# Patient Record
Sex: Female | Born: 1990 | Race: Black or African American | Hispanic: No | Marital: Single | Smoking: Current some day smoker
Health system: Southern US, Community
[De-identification: ages and names within clinical notes are randomized; demographics above are authoritative.]

## PROBLEM LIST (undated history)

## (undated) DIAGNOSIS — N83201 Unspecified ovarian cyst, right side: Secondary | ICD-10-CM

## (undated) DIAGNOSIS — N83202 Unspecified ovarian cyst, left side: Secondary | ICD-10-CM

## (undated) HISTORY — PX: DILATION AND CURETTAGE OF UTERUS: SHX78

---

## 2016-01-07 ENCOUNTER — Emergency Department (HOSPITAL_COMMUNITY)
Admission: EM | Admit: 2016-01-07 | Discharge: 2016-01-07 | Disposition: A | Discharge status: Home / Self Care | Payer: Self-pay | Attending: Emergency Medicine | Admitting: Emergency Medicine

## 2016-01-07 ENCOUNTER — Encounter (HOSPITAL_COMMUNITY): Payer: Self-pay | Admitting: Emergency Medicine

## 2016-01-07 ENCOUNTER — Emergency Department (HOSPITAL_COMMUNITY): Payer: Self-pay

## 2016-01-07 DIAGNOSIS — R51 Headache: Secondary | ICD-10-CM | POA: Insufficient documentation

## 2016-01-07 DIAGNOSIS — R112 Nausea with vomiting, unspecified: Secondary | ICD-10-CM | POA: Insufficient documentation

## 2016-01-07 DIAGNOSIS — R102 Pelvic and perineal pain: Secondary | ICD-10-CM

## 2016-01-07 DIAGNOSIS — R10814 Left lower quadrant abdominal tenderness: Secondary | ICD-10-CM | POA: Insufficient documentation

## 2016-01-07 DIAGNOSIS — R519 Headache, unspecified: Secondary | ICD-10-CM

## 2016-01-07 DIAGNOSIS — R109 Unspecified abdominal pain: Secondary | ICD-10-CM

## 2016-01-07 LAB — CBC
HEMATOCRIT: 41.3 % (ref 36.0–46.0)
HEMOGLOBIN: 14.5 g/dL (ref 12.0–15.0)
MCH: 28.9 pg (ref 26.0–34.0)
MCHC: 35.1 g/dL (ref 30.0–36.0)
MCV: 82.3 fL (ref 78.0–100.0)
PLATELETS: 323 10*3/uL (ref 150–400)
RBC: 5.02 MIL/uL (ref 3.87–5.11)
RDW: 14 % (ref 11.5–15.5)
WBC: 12.3 10*3/uL — AB (ref 4.0–10.5)

## 2016-01-07 LAB — COMPREHENSIVE METABOLIC PANEL
ALT: 14 U/L (ref 14–54)
ANION GAP: 6 (ref 5–15)
AST: 16 U/L (ref 15–41)
Albumin: 4.4 g/dL (ref 3.5–5.0)
Alkaline Phosphatase: 68 U/L (ref 38–126)
BUN: 9 mg/dL (ref 6–20)
CHLORIDE: 107 mmol/L (ref 101–111)
CO2: 27 mmol/L (ref 22–32)
Calcium: 9.7 mg/dL (ref 8.9–10.3)
Creatinine, Ser: 0.93 mg/dL (ref 0.44–1.00)
Glucose, Bld: 91 mg/dL (ref 65–99)
POTASSIUM: 4.2 mmol/L (ref 3.5–5.1)
Sodium: 140 mmol/L (ref 135–145)
Total Bilirubin: 0.5 mg/dL (ref 0.3–1.2)
Total Protein: 8 g/dL (ref 6.5–8.1)

## 2016-01-07 LAB — URINALYSIS, ROUTINE W REFLEX MICROSCOPIC
Bilirubin Urine: NEGATIVE
GLUCOSE, UA: NEGATIVE mg/dL
Hgb urine dipstick: NEGATIVE
Ketones, ur: NEGATIVE mg/dL
LEUKOCYTES UA: NEGATIVE
Nitrite: NEGATIVE
PH: 8.5 — AB (ref 5.0–8.0)
PROTEIN: NEGATIVE mg/dL
Specific Gravity, Urine: 1.024 (ref 1.005–1.030)

## 2016-01-07 LAB — WET PREP, GENITAL
SPERM: NONE SEEN
TRICH WET PREP: NONE SEEN
YEAST WET PREP: NONE SEEN

## 2016-01-07 LAB — LIPASE, BLOOD: LIPASE: 27 U/L (ref 11–51)

## 2016-01-07 LAB — I-STAT BETA HCG BLOOD, ED (MC, WL, AP ONLY): I-stat hCG, quantitative: <5 m[IU]/mL (ref <5)

## 2016-01-07 MED ORDER — STERILE WATER FOR INJECTION IJ SOLN
INTRAMUSCULAR | Status: AC
Start: 2016-01-07 — End: 2016-01-07
  Administered 2016-01-07: 2 mL
  Filled 2016-01-07: qty 10

## 2016-01-07 MED ORDER — AZITHROMYCIN 250 MG PO TABS
1000.0000 mg | ORAL_TABLET | Freq: Every day | ORAL | Status: DC
  Administered 2016-01-07: 1000 mg via ORAL
  Filled 2016-01-07: qty 4

## 2016-01-07 MED ORDER — DIPHENHYDRAMINE HCL 50 MG/ML IJ SOLN
25.0000 mg | Freq: Once | INTRAMUSCULAR | Status: AC
  Administered 2016-01-07: 25 mg via INTRAVENOUS
  Filled 2016-01-07: qty 1

## 2016-01-07 MED ORDER — METOCLOPRAMIDE HCL 5 MG/ML IJ SOLN
10.0000 mg | Freq: Once | INTRAMUSCULAR | Status: AC
  Administered 2016-01-07: 10 mg via INTRAVENOUS
  Filled 2016-01-07: qty 2

## 2016-01-07 MED ORDER — CEFTRIAXONE SODIUM 250 MG IJ SOLR
250.0000 mg | Freq: Once | INTRAMUSCULAR | Status: AC
  Administered 2016-01-07: 250 mg via INTRAMUSCULAR
  Filled 2016-01-07: qty 250

## 2016-01-07 MED ORDER — KETOROLAC TROMETHAMINE 30 MG/ML IJ SOLN
30.0000 mg | Freq: Once | INTRAMUSCULAR | Status: AC
  Administered 2016-01-07: 30 mg via INTRAVENOUS
  Filled 2016-01-07: qty 1

## 2016-01-07 MED ORDER — SODIUM CHLORIDE 0.9 % IV BOLUS (SEPSIS)
1000.0000 mL | Freq: Once | INTRAVENOUS | Status: AC
  Administered 2016-01-07: 1000 mL via INTRAVENOUS

## 2016-01-07 MED ORDER — METRONIDAZOLE 500 MG PO TABS: 500.0000 mg | ORAL_TABLET | Freq: Two times a day (BID) | ORAL | Status: AC

## 2016-01-07 NOTE — Discharge Instructions (Signed)
There is a phone number in your discharge paperwork to call to obtain a primary care provider. Obtain a primary care provider and follow up with them within 2 days regarding your visit to the emergency department today.  Take the Flagyl as prescribed. Do not drink alcohol with this medication.   Use condoms during sexual intercourse to protect yourself from STDs. Obstain from unprotected sex with your boyfriend or anyone until your STD testing has come back.  Return to the emergency department if you experience worsening abdominal pain, uncontrolled vomiting or diarrhea, blood in your vomit or stools, fever, chills.  Abdominal Pain, Adult Many things can cause abdominal pain. Usually, abdominal pain is not caused by a disease and will improve without treatment. It can often be observed and treated at home. Your health care provider will do a physical exam and possibly order blood tests and X-rays to help determine the seriousness of your pain. However, in many cases, more time must pass before a clear cause of the pain can be found. Before that point, your health care provider may not know if you need more testing or further treatment. HOME CARE INSTRUCTIONS Monitor your abdominal pain for any changes. The following actions may help to alleviate any discomfort you are experiencing:  Only take over-the-counter or prescription medicines as directed by your health care provider.  Do not take laxatives unless directed to do so by your health care provider.  Try a clear liquid diet (broth, tea, or water) as directed by your health care provider. Slowly move to a bland diet as tolerated. SEEK MEDICAL CARE IF:  You have unexplained abdominal pain.  You have abdominal pain associated with nausea or diarrhea.  You have pain when you urinate or have a bowel movement.  You experience abdominal pain that wakes you in the night.  You have abdominal pain that is worsened or improved by eating  food.  You have abdominal pain that is worsened with eating fatty foods.  You have a fever. SEEK IMMEDIATE MEDICAL CARE IF:  Your pain does not go away within 2 hours.  You keep throwing up (vomiting).  Your pain is felt only in portions of the abdomen, such as the right side or the left lower portion of the abdomen.  You pass bloody or black tarry stools. MAKE SURE YOU:  Understand these instructions.  Will watch your condition.  Will get help right away if you are not doing well or get worse.   This information is not intended to replace advice given to you by your health care provider. Make sure you discuss any questions you have with your health care provider.   Document Released: 05/03/2005 Document Revised: 04/14/2015 Document Reviewed: 04/02/2013 Elsevier Interactive Patient Education 2016 Elsevier Inc.  Nausea and Vomiting Nausea is a sick feeling that often comes before throwing up (vomiting). Vomiting is a reflex where stomach contents come out of your mouth. Vomiting can cause severe loss of body fluids (dehydration). Children and elderly adults can become dehydrated quickly, especially if they also have diarrhea. Nausea and vomiting are symptoms of a condition or disease. It is important to find the cause of your symptoms. CAUSES   Direct irritation of the stomach lining. This irritation can result from increased acid production (gastroesophageal reflux disease), infection, food poisoning, taking certain medicines (such as nonsteroidal anti-inflammatory drugs), alcohol use, or tobacco use.  Signals from the brain.These signals could be caused by a headache, heat exposure, an inner ear disturbance,  increased pressure in the brain from injury, infection, a tumor, or a concussion, pain, emotional stimulus, or metabolic problems.  An obstruction in the gastrointestinal tract (bowel obstruction).  Illnesses such as diabetes, hepatitis, gallbladder problems, appendicitis,  kidney problems, cancer, sepsis, atypical symptoms of a heart attack, or eating disorders.  Medical treatments such as chemotherapy and radiation.  Receiving medicine that makes you sleep (general anesthetic) during surgery. DIAGNOSIS Your caregiver may ask for tests to be done if the problems do not improve after a few days. Tests may also be done if symptoms are severe or if the reason for the nausea and vomiting is not clear. Tests may include:  Urine tests.  Blood tests.  Stool tests.  Cultures (to look for evidence of infection).  X-rays or other imaging studies. Test results can help your caregiver make decisions about treatment or the need for additional tests. TREATMENT You need to stay well hydrated. Drink frequently but in small amounts.You may wish to drink water, sports drinks, clear broth, or eat frozen ice pops or gelatin dessert to help stay hydrated.When you eat, eating slowly may help prevent nausea.There are also some antinausea medicines that may help prevent nausea. HOME CARE INSTRUCTIONS   Take all medicine as directed by your caregiver.  If you do not have an appetite, do not force yourself to eat. However, you must continue to drink fluids.  If you have an appetite, eat a normal diet unless your caregiver tells you differently.  Eat a variety of complex carbohydrates (rice, wheat, potatoes, bread), lean meats, yogurt, fruits, and vegetables.  Avoid high-fat foods because they are more difficult to digest.  Drink enough water and fluids to keep your urine clear or pale yellow.  If you are dehydrated, ask your caregiver for specific rehydration instructions. Signs of dehydration may include:  Severe thirst.  Dry lips and mouth.  Dizziness.  Dark urine.  Decreasing urine frequency and amount.  Confusion.  Rapid breathing or pulse. SEEK IMMEDIATE MEDICAL CARE IF:   You have blood or brown flecks (like coffee grounds) in your vomit.  You have  black or bloody stools.  You have a severe headache or stiff neck.  You are confused.  You have severe abdominal pain.  You have chest pain or trouble breathing.  You do not urinate at least once every 8 hours.  You develop cold or clammy skin.  You continue to vomit for longer than 24 to 48 hours.  You have a fever. MAKE SURE YOU:   Understand these instructions.  Will watch your condition.  Will get help right away if you are not doing well or get worse.   This information is not intended to replace advice given to you by your health care provider. Make sure you discuss any questions you have with your health care provider.   Document Released: 07/24/2005 Document Revised: 10/16/2011 Document Reviewed: 12/21/2010 Elsevier Interactive Patient Education [employer]2016 Elsevier Inc.

## 2016-01-07 NOTE — ED Notes (Signed)
Pt tolerated po challenge---- consumed 100% of the 2 cups of ginger ale, graham crackers and peanut butter given.

## 2016-01-07 NOTE — Progress Notes (Signed)
EDCM spoke to patient at bedside. Patient confirms she [place] a [place]cp [place]  [facility], informed patient of services there and walk in times.  EDCM also provided patient with list of pcps who accept self pay patients, list of discount pharmacies and websites needymeds.org and GoodRX.com for medication assistance, phone number to inquire about the orange card, phone number to inquire about Medicaid, phone number to inquire about the Affordable Care Act, financial resources in the community such as local churches, [employer], urban ministries, and dental assistance for uninsured patients.  Patient thankful for resources.  No further EDCM needs at this time.  Patient wondering when she will be having her Ultrasound completed as her aunt would like to pick her up in an hour.  After an hour, her aunt will not have a car.  [facility] informed [facility] that she may receive a bus pass if needed.

## 2016-01-07 NOTE — ED Notes (Signed)
Gave pt ginger ale, crackers and PB.

## 2016-01-07 NOTE — ED Notes (Signed)
Patient states that she drank a lot of ETOH last night and having a really bad hangover. Patient co n/v/d and "can't even keep water down".

## 2016-01-07 NOTE — ED Provider Notes (Signed)
CSN: [id number]     Arrival date & time 01/07/16  1447 History   First MD Initiated Contact with Patient 01/07/16 1517     Chief Complaint  Patient presents with  . hangover    . Emesis  . Diarrhea  . Abdominal Pain     (Consider location/radiation/quality/duration/timing/severity/associated sxs/prior Treatment) HPI   Patient is a 25 year old female with no significant medical history presents with nausea vomiting and diarrhea since this morning. Patient states she was out drinking last night and and was drinking tequila. She woke this morning with nausea vomiting, 4 episodes of small watery diarrhea. Associated cramping nonradiating constant abdominal pain that waxes and wanes between a 7/10 to a 10/10. Her headache is pounding, constant, nonradiating, 10/10 with associated photosensitivity. She states associated chills. She denies blood in her vomit or stool, fever, dizziness, syncope, LOC, hitting her head last night. She states she remembers events of last night.  History reviewed. No pertinent past medical history. History reviewed. No pertinent past surgical history. No family history on file. Social History  Substance Use Topics  . Smoking status: Never Smoker   . Smokeless tobacco: None  . Alcohol Use: Yes   OB History    No data available     Review of Systems  Constitutional: Positive for chills. Negative for fever.  HENT: Negative for sore throat and trouble swallowing.   Eyes: Positive for photophobia. Negative for visual disturbance.  Respiratory: Negative for cough, chest tightness and shortness of breath.   Cardiovascular: Negative for chest pain.  Gastrointestinal: Positive for nausea, vomiting, abdominal pain and diarrhea. Negative for blood in stool.  Genitourinary: Negative for dysuria, vaginal bleeding and vaginal discharge.  Musculoskeletal: Negative for back pain and neck pain.  Neurological: Positive for headaches. Negative for dizziness, syncope, speech  difficulty, weakness and numbness.  Psychiatric/Behavioral: Negative for confusion.      Allergies  Review of patient's allergies indicates no known allergies.  Home Medications   Prior to Admission medications   Medication Sig Start Date End Date Taking? Authorizing Provider  metroNIDAZOLE (FLAGYL) 500 MG tablet Take 1 tablet (500 mg total) by mouth 2 (two) times daily. 01/07/16   Nicolis Boody L Pollyann Roa, PA   BP 127/85 mmHg  Pulse 87  Temp(Src) 98 F (36.7 C) (Oral)  Resp 19  SpO2 98% Physical Exam  Constitutional: Pt is oriented to person, place, and time. Pt appears well-developed and well-nourished. No distress.  HENT:  Head: Normocephalic and atraumatic.  Mouth/Throat: Oropharynx is clear and moist.  Eyes: Conjunctivae and EOM are normal. Pupils are equal, round, and reactive to light. No scleral icterus.  No horizontal, vertical or rotational nystagmus  Neck: Normal range of motion. Neck supple.  Full active and passive ROM without pain No nuchal rigidity or meningeal signs  Cardiovascular: Normal rate, regular rhythm and intact distal pulses including radial and DP 2+ bilaterally.   Pulmonary/Chest: Effort normal  No respiratory distress.  Abdominal: Soft. Normal bowel sounds, There is mild generalized tenderness with greater tenderness in the LLQ. There is no rebound and no guarding. No tenderness in the RLQ.   GU: Exam performed by Jerre Simon,  exam chaperoned Date: 01/07/2016 Pelvic exam: normal external genitalia without evidence of trauma. VULVA: normal appearing vulva with no masses, tenderness or lesion. VAGINA: normal appearing vagina with normal color and discharge, no lesions. CERVIX: normal appearing cervix without lesions, cervical motion tenderness absent, cervical os closed with out purulent discharge; vaginal discharge - creamy, malodorous  and milky, Wet prep and DNA probe for chlamydia and GC obtained.   ADNEXA: normal adnexa in size, nontender on the  right, tenderness on the left, and no masses noted UTERUS: uterus is normal size, shape, consistency and nontender.   Musculoskeletal: Normal range of motion.  Neurological: Pt. is alert and oriented to person, place, and time. No cranial nerve deficit.  Exhibits normal muscle tone. Coordination normal.  Mental Status:  Alert, oriented, thought content appropriate. Speech fluent without evidence of aphasia. Able to follow 2 step commands without difficulty.  Cranial Nerves:  II:  Peripheral visual fields grossly normal, pupils equal, round, reactive to light III,IV, VI: ptosis not present, extra-ocular motions intact bilaterally  V,VII: smile symmetric, facial light touch sensation equal VIII: hearing grossly normal bilaterally  IX,X: midline uvula rise  XI: bilateral shoulder shrug equal and strong XII: midline tongue extension  Motor:  5/5 in upper and lower extremities bilaterally including strong and equal grip strength and dorsiflexion/plantar flexion Sensory: light touch normal in all extremities.  Cerebellar: normal finger-to-nose with bilateral upper extremities, pronator drift negative Gait: normal gait and balance Skin: Skin is warm and dry. No rash noted. Pt is not diaphoretic.  Psychiatric: Pt has a normal mood and affect. Behavior is normal. Judgment and thought content normal.  Nursing note and vitals reviewed.  ED Course  Procedures (including critical care time) Labs Review Labs Reviewed  WET PREP, GENITAL - Abnormal; Notable for the following:    Clue Cells Wet Prep HPF POC PRESENT (*)    WBC, Wet Prep HPF POC MANY (*)    All other components within normal limits  CBC - Abnormal; Notable for the following:    WBC 12.3 (*)    All other components within normal limits  URINALYSIS, ROUTINE W REFLEX MICROSCOPIC (NOT AT [facility]) - Abnormal; Notable for the following:    APPearance CLOUDY (*)    pH 8.5 (*)    All other components within normal limits  LIPASE, BLOOD   COMPREHENSIVE METABOLIC PANEL  RPR  HIV ANTIBODY (ROUTINE TESTING)  I-STAT BETA HCG BLOOD, ED ([facility], [facility], [facility] ONLY)  GC/CHLAMYDIA PROBE AMP ([place]) NOT AT [facility]    Imaging Review US Transvaginal Non-ob  01/07/2016  CLINICAL DATA:  Left adnexal tenderness EXAM: TRANSABDOMINAL AND TRANSVAGINAL ULTRASOUND OF PELVIS TECHNIQUE: Both transabdominal and transvaginal ultrasound examinations of the pelvis were performed. Transabdominal technique was performed for global imaging of the pelvis including uterus, ovaries, adnexal regions, and pelvic cul-de-sac. It was necessary to proceed with endovaginal exam following the transabdominal exam to visualize the ovaries. COMPARISON:  None FINDINGS: Uterus Measurements: 6.8 x 3.1 x 4.4 cm. No fibroids or other mass visualized. Endometrium Thickness: 4.9 mm.  No focal abnormality visualized. Right ovary Measurements: 3.7 x 2.6 x 3.5 cm. Normal appearance/no adnexal mass. Left ovary Measurements: 3.8 x 2.5 x 3.1 cm. Normal appearance/no adnexal mass. Other findings No abnormal free fluid. IMPRESSION: 1. No acute abnormalities identified. Doppler imaging of the ovaries was not requested. Electronically Signed   By: Gerome Sam III M.D   On: 01/07/2016 20:33   US Pelvis Complete  01/07/2016  CLINICAL DATA:  Left adnexal tenderness EXAM: TRANSABDOMINAL AND TRANSVAGINAL ULTRASOUND OF PELVIS TECHNIQUE: Both transabdominal and transvaginal ultrasound examinations of the pelvis were performed. Transabdominal technique was performed for global imaging of the pelvis including uterus, ovaries, adnexal regions, and pelvic cul-de-sac. It was necessary to proceed with endovaginal exam following the transabdominal exam to visualize the ovaries.  COMPARISON:  None FINDINGS: Uterus Measurements: 6.8 x 3.1 x 4.4 cm. No fibroids or other mass visualized. Endometrium Thickness: 4.9 mm.  No focal abnormality visualized. Right ovary Measurements: 3.7 x 2.6 x 3.5 cm. Normal appearance/no  adnexal mass. Left ovary Measurements: 3.8 x 2.5 x 3.1 cm. Normal appearance/no adnexal mass. Other findings No abnormal free fluid. IMPRESSION: 1. No acute abnormalities identified. Doppler imaging of the ovaries was not requested. Electronically Signed   By: Gerome Samavid  Williams III M.D   On: 01/07/2016 20:33   I have personally reviewed and evaluated these images and lab results as part of my medical decision-making.   EKG Interpretation None      MDM   Final diagnoses:  Abdominal pain, unspecified abdominal location  Non-intractable vomiting with nausea, vomiting of unspecified type  Nonintractable headache, unspecified chronicity pattern, unspecified headache type   Patient is nontoxic, nonseptic appearing, in no apparent distress, VSS.  Patient's pain and other symptoms adequately managed in emergency department.  Fluid bolus given.  Labs, imaging and vitals reviewed.  Patient does not meet the SIRS or Sepsis criteria.  On exam patient does not have a surgical abdomin and there are no peritoneal signs.  No indication of appendicitis, bowel obstruction, bowel perforation, cholecystitis, diverticulitis, PID or ectopic pregnancy. Pelvic exam revealed mild left adnexal tenderness. Ultrasound revealed no acute abnormalities. Patient's wet prep revealed clue cells and white blood cells. Patient was treated in the ED for gonorrhea and chlamydia. Given a prescription for Flagyl. Discussed with the patient to not consume alcohol while taking this antibiotic. Discussed with the patient practicing safe sex using condoms. Patient was able to tolerate food and liquids while in the ED. She states her stomach pain resolved while in the ED. Patient's headache treated and improved while in the ED. Headache not concerning for [facility], [facility], meningitis or temporal arteritis. Patient was afebrile with no focal neurological deficits, nuchal rigidity or change in vision. Discussed with patient to follow-up with primary care  provider to discuss migraine prophylaxis. Mild nonspecific leukocytosis. Other labs unremarkable. Patient symptoms likely 2/2 a colitis.  Patient discharged home with symptomatic treatment and given strict instructions for follow-up with their primary care physician.  I have also discussed reasons to return immediately to the ER.  Patient expresses understanding and agrees with plan.       Jerre SimonJessica L Tanaysha Alkins, PA 01/08/16 [id number]  Doug SouSam Jacubowitz, MD 01/08/16 Earle [id number]

## 2016-01-08 LAB — HIV ANTIBODY (ROUTINE TESTING W REFLEX): HIV Screen 4th Generation wRfx: NONREACTIVE

## 2016-01-08 LAB — RPR: RPR: NONREACTIVE

## 2016-01-10 LAB — GC/CHLAMYDIA PROBE AMP (~~LOC~~) NOT AT ARMC
CHLAMYDIA, DNA PROBE: POSITIVE — AB
NEISSERIA GONORRHEA: NEGATIVE

## 2016-01-11 ENCOUNTER — Telehealth (HOSPITAL_BASED_OUTPATIENT_CLINIC_OR_DEPARTMENT_OTHER): Payer: Self-pay | Admitting: Emergency Medicine

## 2016-06-29 ENCOUNTER — Inpatient Hospital Stay (HOSPITAL_COMMUNITY)
Admission: AD | Admit: 2016-06-29 | Discharge: 2016-06-29 | Disposition: A | Discharge status: Home / Self Care | Payer: Self-pay | Source: Ambulatory Visit | Attending: Obstetrics and Gynecology | Admitting: Obstetrics and Gynecology

## 2016-06-29 ENCOUNTER — Encounter (HOSPITAL_COMMUNITY): Payer: Self-pay

## 2016-06-29 DIAGNOSIS — R21 Rash and other nonspecific skin eruption: Secondary | ICD-10-CM | POA: Insufficient documentation

## 2016-06-29 DIAGNOSIS — N83201 Unspecified ovarian cyst, right side: Secondary | ICD-10-CM | POA: Insufficient documentation

## 2016-06-29 DIAGNOSIS — X58XXXA Exposure to other specified factors, initial encounter: Secondary | ICD-10-CM | POA: Insufficient documentation

## 2016-06-29 DIAGNOSIS — F17200 Nicotine dependence, unspecified, uncomplicated: Secondary | ICD-10-CM | POA: Insufficient documentation

## 2016-06-29 DIAGNOSIS — T7849XA Other allergy, initial encounter: Secondary | ICD-10-CM | POA: Insufficient documentation

## 2016-06-29 DIAGNOSIS — L500 Allergic urticaria: Secondary | ICD-10-CM

## 2016-06-29 DIAGNOSIS — N83202 Unspecified ovarian cyst, left side: Secondary | ICD-10-CM | POA: Insufficient documentation

## 2016-06-29 HISTORY — DX: Unspecified ovarian cyst, right side: N83.202

## 2016-06-29 HISTORY — DX: Unspecified ovarian cyst, right side: N83.201

## 2016-06-29 LAB — URINALYSIS, ROUTINE W REFLEX MICROSCOPIC
BILIRUBIN URINE: NEGATIVE
Glucose, UA: NEGATIVE mg/dL
Hgb urine dipstick: NEGATIVE
Ketones, ur: NEGATIVE mg/dL
Leukocytes, UA: NEGATIVE
NITRITE: NEGATIVE
PROTEIN: NEGATIVE mg/dL
pH: 7 (ref 5.0–8.0)

## 2016-06-29 LAB — POCT PREGNANCY, URINE: PREG TEST UR: NEGATIVE

## 2016-06-29 MED ORDER — DIPHENHYDRAMINE-ZINC ACETATE 2-0.1 % EX CREA: 1.0000 "application " | TOPICAL_CREAM | Freq: Three times a day (TID) | CUTANEOUS | 0 refills | Status: AC | PRN

## 2016-06-29 MED ORDER — LORATADINE 10 MG PO TABS: 10.0000 mg | ORAL_TABLET | Freq: Every day | ORAL | 0 refills | Status: AC

## 2016-06-29 MED ORDER — LORATADINE 10 MG PO TABS
10.0000 mg | ORAL_TABLET | Freq: Every day | ORAL | Status: DC
  Administered 2016-06-29: 10 mg via ORAL
  Filled 2016-06-29 (×2): qty 1

## 2016-06-29 NOTE — MAU Note (Signed)
Yesterday was at work had itchy spot on her back, now has 3 areas are itchy and warm.

## 2016-06-29 NOTE — Discharge Instructions (Signed)
Angioedema  Angioedema is sudden swelling in the body. The swelling can happen in any part of the body. It often happens on the skin and causes itchy, bumpy patches (hives) to form.  This condition may:  · Happen only one time.  · Happen more than one time. It may come back at random times.  · Keep coming back for a number of years. Someday it may stop coming back.    Follow these instructions at home:  · Take over-the-counter and prescription medicines only as told by your doctor.  · If you were given medicines for emergency allergy treatment, always carry them with you.  · Wear a medical bracelet as told by your doctor.  · Avoid the things that cause your attacks (triggers).  · If this condition was passed to you from your parents and you want to have kids, talk to your doctor. Your kids may also have this condition.  Contact a doctor if:  · You have another attack.  · Your attacks happen more often, even after you take steps to prevent them.  · This condition was passed to you by your parents and you want to have kids.  Get help right away if:  · Your mouth, tongue, or lips get very swollen.  · You have trouble breathing.  · You have trouble swallowing.  · You pass out (faint).  This information is not intended to replace advice given to you by your health care provider. Make sure you discuss any questions you have with your health care provider.  Document Released: 07/12/2009 Document Revised: 02/23/2016 Document Reviewed: 02/01/2016  Elsevier Interactive Patient Education © 2017 Elsevier Inc.

## 2016-06-29 NOTE — MAU Provider Note (Signed)
Chief Complaint:  Rash   First Provider Initiated Contact with Patient 06/29/16 1411       HPI: Angel Rojas is a 25 y.o. G1P0010 who presents to maternity admissions reporting areas on her "backside" which started out as small spots "like mosquito bites" but one of them got progressively larger and more itchy..  No known contact with sources of insects   No exposure to anything she deems sensitive.   Used cortisone cream last night without any relief.   Was visiting a friend [facility] at [facility] "so I came here".  Wants a note to be out of work today.  She reports no vaginal bleeding, vaginal itching/burning, urinary symptoms, h/a, dizziness, n/v, or fever/chills.    Allergic Reaction  This is a new problem. The current episode started yesterday. The problem occurs constantly. The problem has been gradually worsening since onset. The problem is moderate. It is unknown what she was exposed to. The time of exposure is unknown. Associated symptoms include itching and a rash (not so much a rash as discreet lesions). Pertinent negatives include no abdominal pain, chest pressure, coughing, diarrhea, difficulty breathing or vomiting. Swelling location: only of lesions. Past treatments include topical corticosteroid. The treatment provided no relief.   RN Note: Angel EstelleYesterday was at work had itchy spot on her back, now has 3 areas are itchy and warm.   Past Medical History: Past Medical History:  Diagnosis Date  . Bilateral ovarian cysts     Past obstetric history: OB History  Gravida Para Term Preterm AB Living  1       1    SAB TAB Ectopic Multiple Live Births  1       0    # Outcome Date GA Lbr Len/2nd Weight Sex Delivery Anes PTL Lv  1 SAB               Past Surgical History: Past Surgical History:  Procedure Laterality Date  . DILATION AND CURETTAGE OF UTERUS      Family History: No family history on file.  Social History: Social History  Substance Use Topics  . Smoking status:  Current Some Day Smoker  . Smokeless tobacco: Never Used  . Alcohol use Yes    Allergies: No Known Allergies  Meds:  Prescriptions Prior to Admission  Medication Sig Dispense Refill Last Dose  . metroNIDAZOLE (FLAGYL) 500 MG tablet Take 1 tablet (500 mg total) by mouth 2 (two) times daily. 14 tablet 0     I have reviewed patient's Past Medical Hx, Surgical Hx, Family Hx, Social Hx, medications and allergies.  ROS:  Review of Systems  Respiratory: Negative for cough.   Gastrointestinal: Negative for abdominal pain, diarrhea and vomiting.  Skin: Positive for itching and rash (not so much a rash as discreet lesions).   Other systems negative     Physical Exam  Patient Vitals for the past 24 hrs:  BP Temp Temp src Pulse Resp Height Weight  06/29/16 1400 122/71 98.2 F (36.8 C) Oral 85 16 5\' 8"  (1.727 m) 204 lb (92.5 kg)   Constitutional: Well-developed, well-nourished female in no acute distress.  Cardiovascular: normal rate and rhythm, no ectopy audible, S1 & S2 heard, no murmur Respiratory: normal effort, no distress. Lungs CTAB with no wheezes or crackles GI: Abd soft, non-tender.  Nondistended.  No rebound, No guarding.  Bowel Sounds audible  MS: Extremities nontender, no edema, normal ROM Neurologic: Alert and oriented x 4.   Grossly nonfocal. GU:  Neg CVAT. Skin:  Warm and Dry            There are three dime-sized macular lesions on left buttock             There is a large 6-8cm lesion which is erethematous and swollen.  Slight induration but not terribly hard.  No fluctuance.  No evidence of any pointing.  Seen by Dr Emelda FearFerguson who agrees. Does not look like MRSA.  Appears to be some kind of histamine reaction.  Psych:  Affect appropriate.  PELVIC EXAM: deferred       Labs: Results for orders placed or performed during the hospital encounter of 06/29/16 (from the past 24 hour(s))  Pregnancy, urine POC     Status: None   Collection Time: 06/29/16  2:10 PM  Result  Value Ref Range   Preg Test, Ur NEGATIVE NEGATIVE      Imaging:  No results found.  MAU Course/MDM: I have ordered labs as follows: UPT Imaging ordered: none Results reviewed.   Consult Dr Emelda FearFerguson, who agrees appears to be an allergic/histamine type of lesion.   Treatments in MAU included Claritin.   Pt stable at time of discharge.  Assessment: Allergic skin reaction to unknown substance  Plan: Discharge home Recommend Benadryl and cortisone cream, alternating Rx sent for Benadryl cream for itching Rx Claritin and Alternate with Benadryl for antihistamine effect If area becomes larger or fluctuant, pt will go to nearest Urgent Care center for evaluation since this is not a GYN lesion   Encouraged to return here or to other Urgent Care/ED if she develops worsening of symptoms, increase in pain, fever, or other concerning symptoms.   [name] CNM, MSN Certified Nurse-Midwife 06/29/2016 2:22 PM

## 2016-08-09 ENCOUNTER — Emergency Department (HOSPITAL_COMMUNITY)
Admission: EM | Admit: 2016-08-09 | Discharge: 2016-08-09 | Disposition: A | Discharge status: Home / Self Care | Payer: Self-pay | Attending: Emergency Medicine | Admitting: Emergency Medicine

## 2016-08-09 ENCOUNTER — Encounter (HOSPITAL_COMMUNITY): Payer: Self-pay | Admitting: Adult Health

## 2016-08-09 DIAGNOSIS — F17200 Nicotine dependence, unspecified, uncomplicated: Secondary | ICD-10-CM | POA: Insufficient documentation

## 2016-08-09 DIAGNOSIS — R112 Nausea with vomiting, unspecified: Secondary | ICD-10-CM | POA: Insufficient documentation

## 2016-08-09 DIAGNOSIS — R197 Diarrhea, unspecified: Secondary | ICD-10-CM | POA: Insufficient documentation

## 2016-08-09 DIAGNOSIS — R110 Nausea: Secondary | ICD-10-CM

## 2016-08-09 LAB — COMPREHENSIVE METABOLIC PANEL
ALBUMIN: 4.2 g/dL (ref 3.5–5.0)
ALT: 16 U/L (ref 14–54)
AST: 16 U/L (ref 15–41)
Alkaline Phosphatase: 75 U/L (ref 38–126)
Anion gap: 8 (ref 5–15)
BILIRUBIN TOTAL: 1 mg/dL (ref 0.3–1.2)
BUN: 5 mg/dL — ABNORMAL LOW (ref 6–20)
CHLORIDE: 105 mmol/L (ref 101–111)
CO2: 25 mmol/L (ref 22–32)
Calcium: 9.9 mg/dL (ref 8.9–10.3)
Creatinine, Ser: 0.94 mg/dL (ref 0.44–1.00)
GFR calc Af Amer: >60 mL/min (ref >60)
GFR calc non Af Amer: >60 mL/min (ref >60)
GLUCOSE: 88 mg/dL (ref 65–99)
POTASSIUM: 3.8 mmol/L (ref 3.5–5.1)
SODIUM: 138 mmol/L (ref 135–145)
Total Protein: 7.1 g/dL (ref 6.5–8.1)

## 2016-08-09 LAB — URINALYSIS, ROUTINE W REFLEX MICROSCOPIC
BILIRUBIN URINE: NEGATIVE
GLUCOSE, UA: NEGATIVE mg/dL
Hgb urine dipstick: NEGATIVE
KETONES UR: NEGATIVE mg/dL
Leukocytes, UA: NEGATIVE
Nitrite: NEGATIVE
PH: 8 (ref 5.0–8.0)
Protein, ur: NEGATIVE mg/dL
SPECIFIC GRAVITY, URINE: 1.014 (ref 1.005–1.030)

## 2016-08-09 LAB — CBC
HEMATOCRIT: 41.1 % (ref 36.0–46.0)
HEMOGLOBIN: 14.5 g/dL (ref 12.0–15.0)
MCH: 28.8 pg (ref 26.0–34.0)
MCHC: 35.3 g/dL (ref 30.0–36.0)
MCV: 81.7 fL (ref 78.0–100.0)
Platelets: 290 10*3/uL (ref 150–400)
RBC: 5.03 MIL/uL (ref 3.87–5.11)
RDW: 13.8 % (ref 11.5–15.5)
WBC: 11.1 10*3/uL — ABNORMAL HIGH (ref 4.0–10.5)

## 2016-08-09 LAB — POC URINE PREG, ED: Preg Test, Ur: NEGATIVE

## 2016-08-09 MED ORDER — ONDANSETRON 4 MG PO TBDP
8.0000 mg | ORAL_TABLET | Freq: Once | ORAL | Status: AC
  Administered 2016-08-09: 8 mg via ORAL
  Filled 2016-08-09: qty 2

## 2016-08-09 MED ORDER — ONDANSETRON 4 MG PO TBDP
ORAL_TABLET | ORAL | Status: AC
  Filled 2016-08-09: qty 1

## 2016-08-09 MED ORDER — DIPHENHYDRAMINE HCL 25 MG PO CAPS
25.0000 mg | ORAL_CAPSULE | Freq: Once | ORAL | Status: AC
Start: 2016-08-09 — End: 2016-08-09
  Administered 2016-08-09: 25 mg via ORAL
  Filled 2016-08-09: qty 1

## 2016-08-09 MED ORDER — ONDANSETRON 4 MG PO TBDP
4.0000 mg | ORAL_TABLET | Freq: Once | ORAL | Status: AC | PRN
  Administered 2016-08-09: 4 mg via ORAL

## 2016-08-09 MED ORDER — ONDANSETRON 8 MG PO TBDP: 8.0000 mg | ORAL_TABLET | Freq: Three times a day (TID) | ORAL | 0 refills | Status: AC | PRN

## 2016-08-09 NOTE — ED Notes (Signed)
Pt given a cup of water and encouraged to drink

## 2016-08-09 NOTE — ED Notes (Signed)
Pt reports feeling fine after drinking PO fluids

## 2016-08-09 NOTE — ED Provider Notes (Signed)
[facility]-EMER[facility]Y DEPT Provider Note   CSN: [id number] Arrival date & time: 08/09/16  1704  By signing my name below, I, Arianna Nassar, attest that this documentation has been prepared under the direction and in the presence of Margarita Grizzleanielle Lindell Renfrew, MD.  Electronically Signed: Octavia HeirArianna Nassar, ED Scribe. 08/09/16. 6:46 PM.    History   Chief Complaint Chief Complaint  Patient presents with  . Emesis  . Diarrhea    The history is provided by the patient. No language interpreter was used.   HPI Comments: Angel Rojas is a 26 y.o. female who presents to the Emergency Department complaining of sudden onset, gradual worsening vomiting that began this morning. Pt has been having associated nausea, diarrhea, abdominal pain, and loss of appetite. She says that her symptoms started with loss of appetite than began last night and gradually progressed to the rest of her symptoms of vomiting and diarrhea. She notes the last time she vomited was before coming to the ED which was 2 hours ago. She has not taken any medications to relieve her symptoms. Pt notes her last normal menstrual cycle was in November. Pt is sexually active and is not on any birth control. She notes that she has irregular menstrual cycles. Denies fever or chills.   Pt expresses that she has also been having lower lip swelling with mild erythema. She has no known drug allergies or food allergies.    Past Medical History:  Diagnosis Date  . Bilateral ovarian cysts     There are no active problems to display for this patient.   Past Surgical History:  Procedure Laterality Date  . DILATION AND CURETTAGE OF UTERUS      OB History    Gravida Para Term Preterm AB Living   1       1     SAB TAB Ectopic Multiple Live Births   1       0       Home Medications    Prior to Admission medications   Medication Sig Start Date End Date Taking? Authorizing Provider  diphenhydrAMINE-zinc acetate (BENADRYL EXTRA STRENGTH) cream  Apply 1 application topically 3 (three) times daily as needed for itching. 06/29/16   Aviva SignsMarie L Williams, CNM  loratadine (CLARITIN) 10 MG tablet Take 1 tablet (10 mg total) by mouth daily. 06/30/16   Aviva SignsMarie L Williams, CNM  metroNIDAZOLE (FLAGYL) 500 MG tablet Take 1 tablet (500 mg total) by mouth 2 (two) times daily. 01/07/16   Jerre SimonJessica L Focht, PA    Family History History reviewed. No pertinent family history.  Social History Social History  Substance Use Topics  . Smoking status: Current Some Day Smoker  . Smokeless tobacco: Never Used  . Alcohol use Yes     Allergies   Patient has no known allergies.   Review of Systems Review of Systems  Constitutional: Positive for appetite change and chills. Negative for fever.  HENT: Positive for facial swelling (lower lip swelling).   Gastrointestinal: Positive for diarrhea, nausea and vomiting.  All other systems reviewed and are negative.    Physical Exam Updated Vital Signs BP 132/85 (BP Location: Right Arm)   Pulse 85   Temp 98.8 F (37.1 C) (Oral)   Resp 18   Wt 210 lb (95.3 kg)   LMP 06/28/2016 (Approximate)   SpO2 98%   BMI 31.93 kg/m   Physical Exam  Constitutional: She is oriented to person, place, and time. She appears well-developed and well-nourished.  HENT:  Head: Normocephalic.  Contusion to right periorbital area, swelling of the lower lip, redness outside [place] border, throat is clear  Eyes: EOM are normal.  Neck: Normal range of motion.  Pulmonary/Chest: Effort normal.  Abdominal: She exhibits no distension.  Musculoskeletal: Normal range of motion.  Neurological: She is alert and oriented to person, place, and time.  Psychiatric: She has a normal mood and affect.  Nursing note and vitals reviewed.    ED Treatments / Results  DIAGNOSTIC STUDIES: Oxygen Saturation is 98% on RA, normal by my interpretation.  COORDINATION OF CARE:  6:25 PM Discussed treatment plan with pt at bedside and pt agreed  to plan. Will give pt zofran and benadryl.  7:14 PM Pt is able to tolerate fluids. Will discharge home.  Labs (all labs ordered are listed, but only abnormal results are displayed) Labs Reviewed  COMPREHENSIVE METABOLIC PANEL - Abnormal; Notable for the following:       Result Value   BUN 5 (*)    All other components within normal limits  CBC - Abnormal; Notable for the following:    WBC 11.1 (*)    All other components within normal limits  URINALYSIS, ROUTINE W REFLEX MICROSCOPIC  POC URINE PREG, ED    EKG  EKG Interpretation None       Radiology No results found.  Procedures Procedures (including critical care time)  Medications Ordered in ED Medications  ondansetron (ZOFRAN-ODT) 4 MG disintegrating tablet (not administered)  ondansetron (ZOFRAN-ODT) disintegrating tablet 4 mg (4 mg Oral Given 08/09/16 1720)     Initial Impression / Assessment and Plan / ED Course  I have reviewed the triage vital signs and the nursing notes.  Pertinent labs & imaging results that were available during my care of the patient were reviewed by me and considered in my medical decision making (see chart for details).  Clinical Course      Tolerating fluids well.   Final Clinical Impressions(s) / ED Diagnoses   Final diagnoses:  Nausea  Non-intractable vomiting with nausea, unspecified vomiting type   I personally performed the services described in this documentation, which was scribed in my presence. The recorded information has been reviewed and considered.  New Prescriptions New Prescriptions   ONDANSETRON (ZOFRAN ODT) 8 MG DISINTEGRATING TABLET    Take 1 tablet (8 mg total) by mouth every 8 (eight) hours as needed for nausea or vomiting.     Margarita Grizzle, MD 08/09/16 [phone]

## 2016-08-09 NOTE — ED Triage Notes (Signed)
Presents with loss of appetite that began last night-this morning woke up with abdominal pain, vomiting and diarrhea-unable to hold liquids down-endorses emesis x5 and diarrhea multiple times. Reports blood in emesis.

## 2016-08-28 ENCOUNTER — Emergency Department (HOSPITAL_COMMUNITY)
Admission: EM | Admit: 2016-08-28 | Discharge: 2016-08-29 | Disposition: A | Discharge status: Home / Self Care | Payer: Self-pay | Attending: Emergency Medicine | Admitting: Emergency Medicine

## 2016-08-28 ENCOUNTER — Encounter (HOSPITAL_COMMUNITY): Payer: Self-pay | Admitting: Emergency Medicine

## 2016-08-28 DIAGNOSIS — S0922XA Traumatic rupture of left ear drum, initial encounter: Secondary | ICD-10-CM | POA: Insufficient documentation

## 2016-08-28 DIAGNOSIS — Z79899 Other long term (current) drug therapy: Secondary | ICD-10-CM | POA: Insufficient documentation

## 2016-08-28 DIAGNOSIS — H7292 Unspecified perforation of tympanic membrane, left ear: Secondary | ICD-10-CM

## 2016-08-28 DIAGNOSIS — Y999 Unspecified external cause status: Secondary | ICD-10-CM | POA: Insufficient documentation

## 2016-08-28 DIAGNOSIS — F17200 Nicotine dependence, unspecified, uncomplicated: Secondary | ICD-10-CM | POA: Insufficient documentation

## 2016-08-28 DIAGNOSIS — Y939 Activity, unspecified: Secondary | ICD-10-CM | POA: Insufficient documentation

## 2016-08-28 DIAGNOSIS — W2105XA Struck by basketball, initial encounter: Secondary | ICD-10-CM | POA: Insufficient documentation

## 2016-08-28 DIAGNOSIS — Y929 Unspecified place or not applicable: Secondary | ICD-10-CM | POA: Insufficient documentation

## 2016-08-28 NOTE — ED Notes (Addendum)
Pt resting calmly on stretcher and is c/o left ear pain and drainage, Pt reports pain started lastnight and radiates to the middle of her head giving her a throbbing HA. Pt does have brownish red moderate  drainage coming from left ear. Pt does report that last night she was hit with a basketball on her left ear. Pt does report that hearing has decreased some in the left ear.

## 2016-08-28 NOTE — ED Triage Notes (Signed)
Pt reports getting hit by basketball on the left side of head last night. Pt is having thick brown like fluid drain out of left ear with difficulty hearing. Pt also had pain on left side of neck.

## 2016-08-29 MED ORDER — OFLOXACIN 0.3 % OP SOLN: 1.0000 [drp] | Freq: Two times a day (BID) | OPHTHALMIC | 0 refills | Status: AC

## 2016-08-29 NOTE — Discharge Instructions (Signed)
See the ENT doctor as requested in 2 weeks. Keep the ear clean and dry.

## 2016-08-29 NOTE — ED Provider Notes (Signed)
[facility]-EMERGENCY DEPT Provider Note   CSN: [id number] Arrival date & time: 08/28/16  1952     History   Chief Complaint Chief Complaint  Patient presents with  . Ear Drainage    HPI Angel Rojas is a 26 y.o. female.  HPI Pt comes in with cc of ear drainage. Pt reports getting hit by basketball on the left side of head last night. Pt is having thick brown like fluid drain out of left ear with difficulty hearing. Pt also had pain on left side of neck Pt has no dizziness. She has a muffled sound in her ear everytime she breaths or coughs. Pt has some pain in her ear as well.  Past Medical History:  Diagnosis Date  . Bilateral ovarian cysts     There are no active problems to display for this patient.   Past Surgical History:  Procedure Laterality Date  . DILATION AND CURETTAGE OF UTERUS      OB History    Gravida Para Term Preterm AB Living   1       1     SAB TAB Ectopic Multiple Live Births   1       0       Home Medications    Prior to Admission medications   Medication Sig Start Date End Date Taking? Authorizing Provider  diphenhydrAMINE-zinc acetate (BENADRYL EXTRA STRENGTH) cream Apply 1 application topically 3 (three) times daily as needed for itching. 06/29/16   Aviva SignsMarie L Williams, CNM  loratadine (CLARITIN) 10 MG tablet Take 1 tablet (10 mg total) by mouth daily. 06/30/16   Aviva SignsMarie L Williams, CNM  metroNIDAZOLE (FLAGYL) 500 MG tablet Take 1 tablet (500 mg total) by mouth 2 (two) times daily. 01/07/16   Jerre SimonJessica L Focht, PA  ofloxacin (OCUFLOX) 0.3 % ophthalmic solution Place 1 drop into the right ear 2 (two) times daily. Until better 08/29/16   Derwood KaplanAnkit Lateya Dauria, MD  ondansetron (ZOFRAN ODT) 8 MG disintegrating tablet Take 1 tablet (8 mg total) by mouth every 8 (eight) hours as needed for nausea or vomiting. 08/09/16   Margarita Grizzleanielle Ray, MD    Family History No family history on file.  Social History Social History  Substance Use Topics  . Smoking status: Current  Some Day Smoker  . Smokeless tobacco: Never Used  . Alcohol use Yes     Allergies   Patient has no known allergies.   Review of Systems Review of Systems  Constitutional: Positive for activity change.  HENT: Positive for hearing loss.   Eyes: Negative for visual disturbance.  Musculoskeletal: Positive for neck pain. Negative for neck stiffness.  Allergic/Immunologic: Negative for immunocompromised state.     Physical Exam Updated Vital Signs BP 126/88 (BP Location: Left Arm)   Pulse 90   Temp 98.4 F (36.9 C) (Oral)   Resp 18   SpO2 100%   Physical Exam  Constitutional: She is oriented to person, place, and time. She appears well-developed.  HENT:  Head: Normocephalic and atraumatic.  Pt's L ear has dark fluid in the canal, and a ruptured / punctured TM is noted.  Eyes: EOM are normal.  Neck: Normal range of motion. Neck supple.  No midline c-spine tenderness, pt able to turn head to 45 degrees bilaterally without any pain and able to flex neck to the chest and extend without any pain or neurologic symptoms. No crepitus.  Cardiovascular: Normal rate.   Pulmonary/Chest: Effort normal. No stridor.  Abdominal: Bowel sounds are normal.  Lymphadenopathy:    She has no cervical adenopathy.  Neurological: She is alert and oriented to person, place, and time.  Skin: Skin is warm and dry.  Nursing note and vitals reviewed.    ED Treatments / Results  Labs (all labs ordered are listed, but only abnormal results are displayed) Labs Reviewed - No data to display  EKG  EKG Interpretation None       Radiology No results found.  Procedures Procedures (including critical care time)  Medications Ordered in ED Medications - No data to display   Initial Impression / Assessment and Plan / ED Course  I have reviewed the triage vital signs and the nursing notes.  Pertinent labs & imaging results that were available during my care of the patient were reviewed by me  and considered in my medical decision making (see chart for details).    Pt with traumatic rupture of the TM. Will start ofloxacin. She has no vertigo. + hearing loss. Additionally, she has neck pain, but no hard signs for severe trauma.    Final Clinical Impressions(s) / ED Diagnoses   Final diagnoses:  Ruptured tympanic membrane, left    New Prescriptions New Prescriptions   OFLOXACIN (OCUFLOX) 0.3 % OPHTHALMIC SOLUTION    Place 1 drop into the right ear 2 (two) times daily. Until better     Derwood Kaplan, MD 08/29/16 [phone]

## 2017-01-04 ENCOUNTER — Encounter (HOSPITAL_BASED_OUTPATIENT_CLINIC_OR_DEPARTMENT_OTHER): Payer: Self-pay | Admitting: *Deleted

## 2017-01-04 ENCOUNTER — Emergency Department (HOSPITAL_BASED_OUTPATIENT_CLINIC_OR_DEPARTMENT_OTHER)
Admission: EM | Admit: 2017-01-04 | Discharge: 2017-01-04 | Disposition: A | Discharge status: Home / Self Care | Payer: Self-pay | Attending: Emergency Medicine | Admitting: Emergency Medicine

## 2017-01-04 DIAGNOSIS — L509 Urticaria, unspecified: Secondary | ICD-10-CM | POA: Insufficient documentation

## 2017-01-04 DIAGNOSIS — F17200 Nicotine dependence, unspecified, uncomplicated: Secondary | ICD-10-CM | POA: Insufficient documentation

## 2017-01-04 MED ORDER — PREDNISONE 10 MG PO TABS: 20.0000 mg | ORAL_TABLET | Freq: Two times a day (BID) | ORAL | 0 refills | Status: AC

## 2017-01-04 MED ORDER — HYDROXYZINE HCL 25 MG PO TABS: 25.0000 mg | ORAL_TABLET | Freq: Four times a day (QID) | ORAL | 0 refills | Status: AC

## 2017-01-04 MED FILL — predniSONE 10 MG TABS: 10 | 3 days supply | Qty: 12 | Fill #0

## 2017-01-04 MED FILL — hydrOXYzine HCL 25 MG TABS: 25 | 4 days supply | Qty: 15 | Fill #0

## 2017-01-04 NOTE — Discharge Instructions (Signed)
Prednisone as prescribed.  Hydroxyzine as prescribed.  Return to the emergency department if you develop difficulty breathing, throat swelling, or other new and concerning symptoms.

## 2017-01-04 NOTE — ED Provider Notes (Signed)
[facility] Provider Note   CSN: [id number] Arrival date & time: 01/04/17  1104     History   Chief Complaint Chief Complaint  Patient presents with  . Urticaria    HPI Angel Rojas is a 26 y.o. female.  Patient is a 26 year old female with no significant past medical history. She presents for evaluation of rash. This started approximately one week ago in the absence of any new exposures that she can think of. She denies any new foods, medications, detergents, perfumes, bed sheets, or clothing. She has tried taking Benadryl which has helped. The rash is red and itchy and located on her torso and extremities.      Past Medical History:  Diagnosis Date  . Bilateral ovarian cysts     There are no active problems to display for this patient.   Past Surgical History:  Procedure Laterality Date  . DILATION AND CURETTAGE OF UTERUS      OB History    Gravida Para Term Preterm AB Living   1       1     SAB TAB Ectopic Multiple Live Births   1       0       Home Medications    Prior to Admission medications   Medication Sig Start Date End Date Taking? Authorizing Provider  diphenhydrAMINE-zinc acetate (BENADRYL EXTRA STRENGTH) cream Apply 1 application topically 3 (three) times daily as needed for itching. 06/29/16   Aviva SignsWilliams, Marie L, CNM  loratadine (CLARITIN) 10 MG tablet Take 1 tablet (10 mg total) by mouth daily. 06/30/16   Aviva SignsWilliams, Marie L, CNM  metroNIDAZOLE (FLAGYL) 500 MG tablet Take 1 tablet (500 mg total) by mouth 2 (two) times daily. 01/07/16   Focht, Joyce CopaJessica L, PA  ofloxacin (OCUFLOX) 0.3 % ophthalmic solution Place 1 drop into the right ear 2 (two) times daily. Until better 08/29/16   Derwood KaplanNanavati, Ankit, MD  ondansetron (ZOFRAN ODT) 8 MG disintegrating tablet Take 1 tablet (8 mg total) by mouth every 8 (eight) hours as needed for nausea or vomiting. 08/09/16   Margarita Grizzleay, Danielle, MD    Family History No family history on file.  Social  History Social History  Substance Use Topics  . Smoking status: Current Some Day Smoker  . Smokeless tobacco: Never Used  . Alcohol use Yes     Allergies   Patient has no known allergies.   Review of Systems Review of Systems  All other systems reviewed and are negative.    Physical Exam Updated Vital Signs BP (!) 118/91   Pulse 88   Temp 97.9 F (36.6 C) (Oral)   Resp 20   Ht 5\' 9"  (1.753 m)   Wt 95.3 kg (210 lb)   LMP 12/07/2016   SpO2 100%   BMI 31.01 kg/m   Physical Exam  Constitutional: She is oriented to person, place, and time. She appears well-developed and well-nourished. No distress.  HENT:  Head: Normocephalic and atraumatic.  Mouth/Throat: Oropharynx is clear and moist.  No swelling of the oral mucosa is noted.  Neck: Normal range of motion. Neck supple.  Cardiovascular: Normal rate and regular rhythm.  Exam reveals no gallop and no friction rub.   No murmur heard. Pulmonary/Chest: Effort normal and breath sounds normal. No stridor. No respiratory distress. She has no wheezes.  Abdominal: Soft. Bowel sounds are normal. She exhibits no distension. There is no tenderness.  Musculoskeletal: Normal range of motion.  Neurological: She is alert and  oriented to person, place, and time.  Skin: Skin is warm and dry. She is not diaphoretic.  There is a generalized urticarial rash present to the torso and extremities.  Nursing note and vitals reviewed.    ED Treatments / Results  Labs (all labs ordered are listed, but only abnormal results are displayed) Labs Reviewed - No data to display  EKG  EKG Interpretation None       Radiology No results found.  Procedures Procedures (including critical care time)  Medications Ordered in ED Medications - No data to display   Initial Impression / Assessment and Plan / ED Course  I have reviewed the triage vital signs and the nursing notes.  Pertinent labs & imaging results that were available during  my care of the patient were reviewed by me and considered in my medical decision making (see chart for details).  Patient with urticaria. This will be treated with prednisone, hydroxyzine, and as needed return.  Final Clinical Impressions(s) / ED Diagnoses   Final diagnoses:  None    New Prescriptions New Prescriptions   No medications on file     Geoffery Lyons, MD 01/04/17 1124

## 2017-01-04 NOTE — ED Triage Notes (Signed)
Hives and itching for a week. She started taking Benadryl yesterday.

## 2017-01-04 NOTE — ED Notes (Signed)
Patient has full body itching.

## 2017-01-25 IMAGING — US US PELVIS COMPLETE
1 series · 14 of 25 positions shown · non-contrast
Comparison: None

CLINICAL DATA: Left adnexal tenderness



[Series 1: us pelvis complete · 0.26mm/px · 14 of 95 slices shown]
[im 1/95]
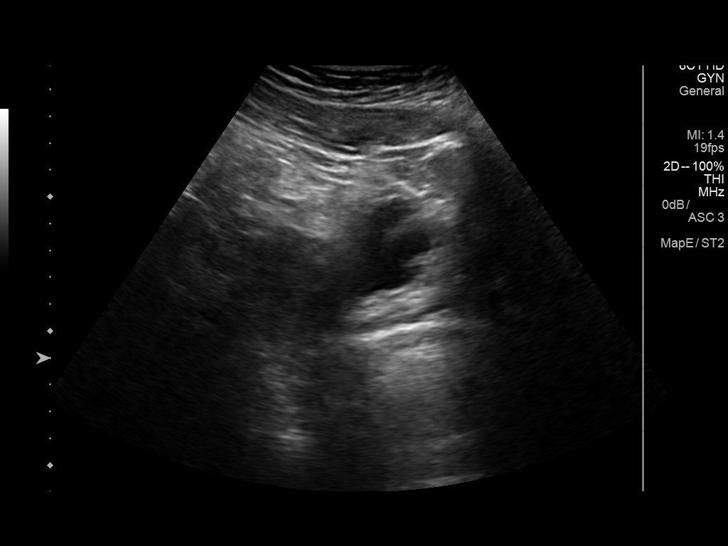
[im 8/95]
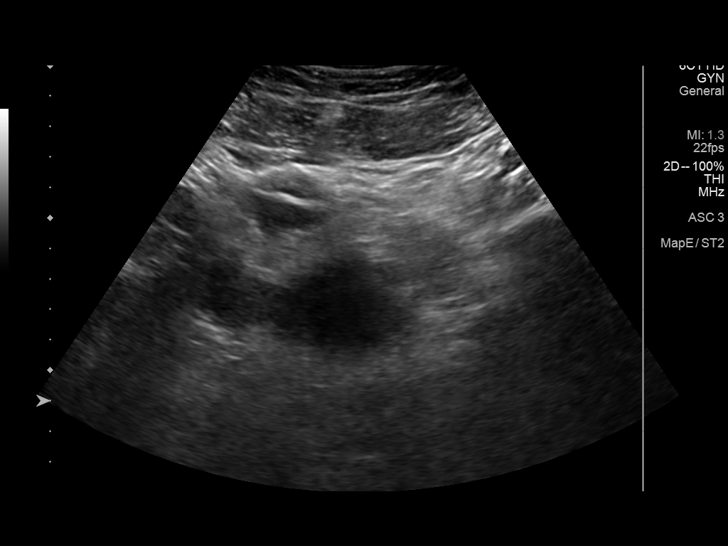
[im 16/95]
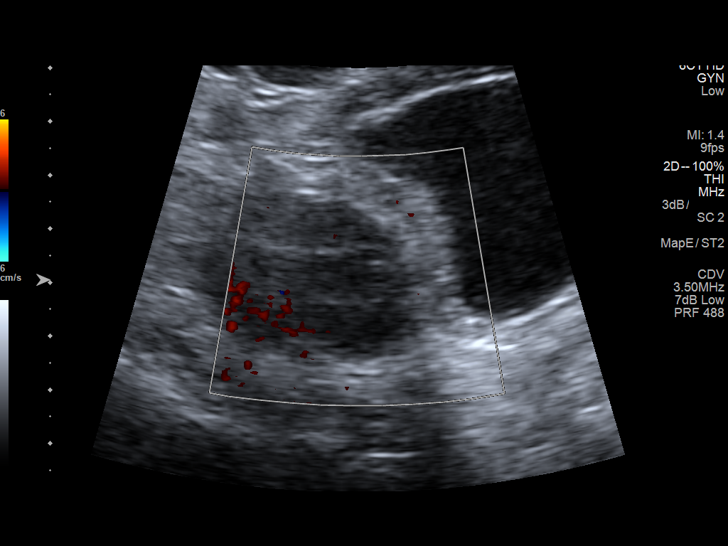
[im 24/95]
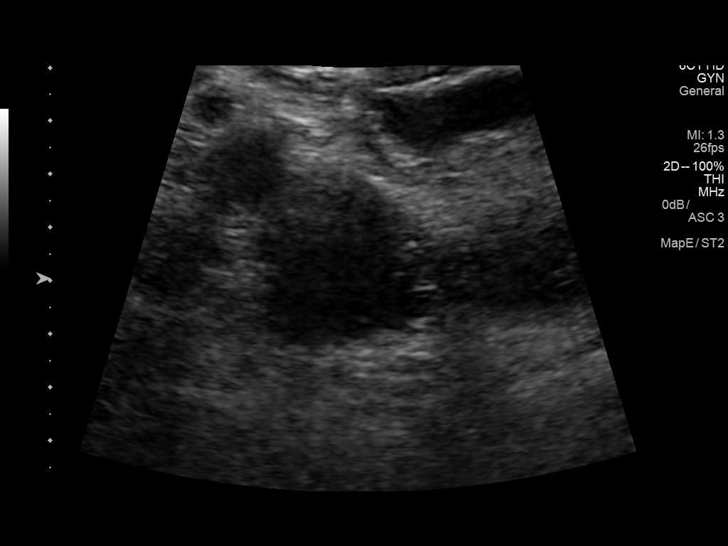
[im 32/95]
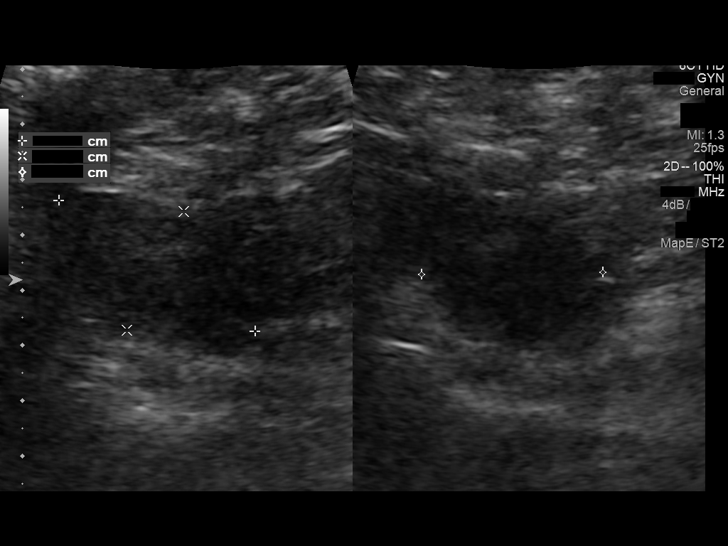
[im 36/95]
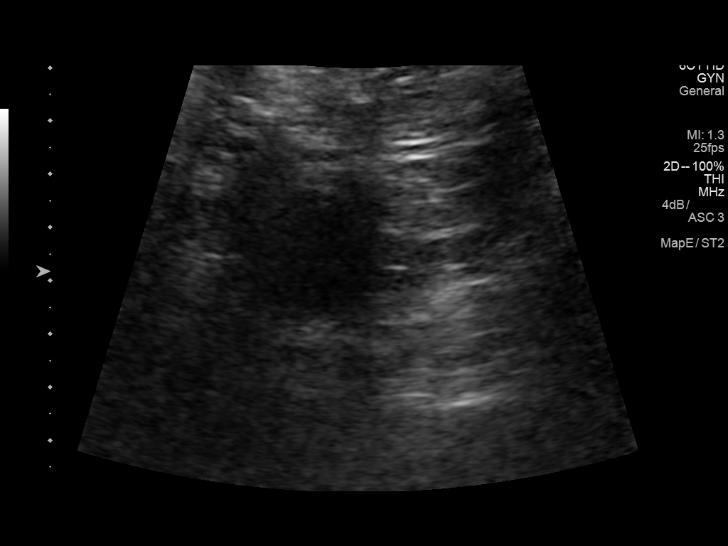
[im 44/95]
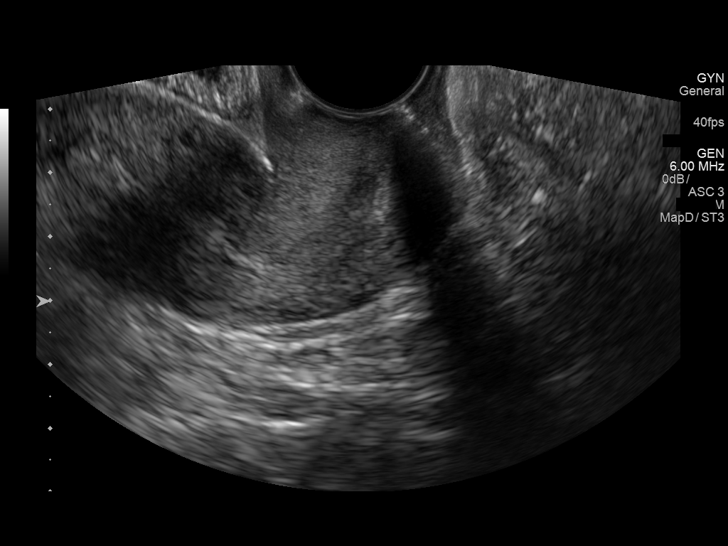
[im 51/95]
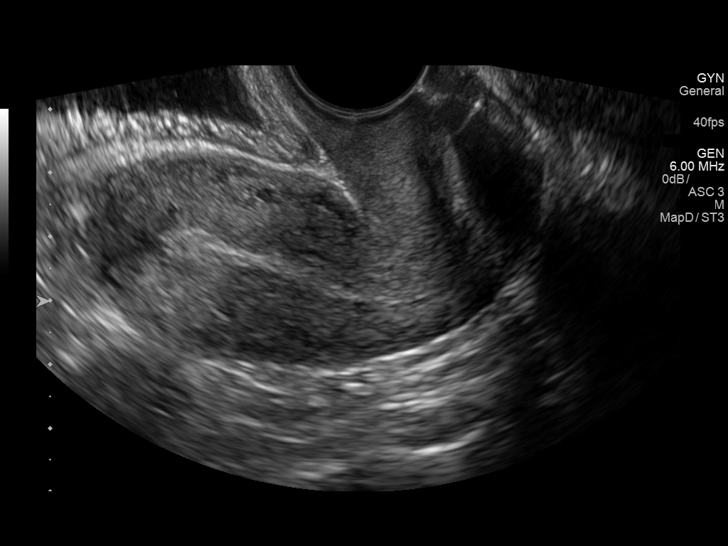
[im 59/95]
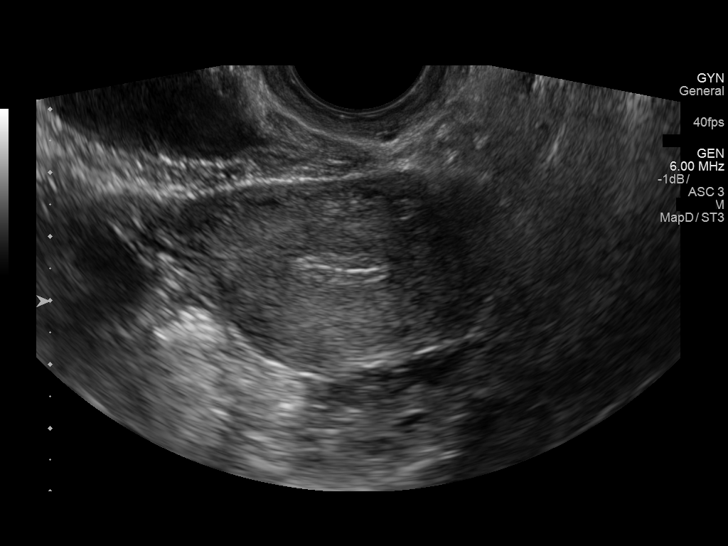
[im 63/95]
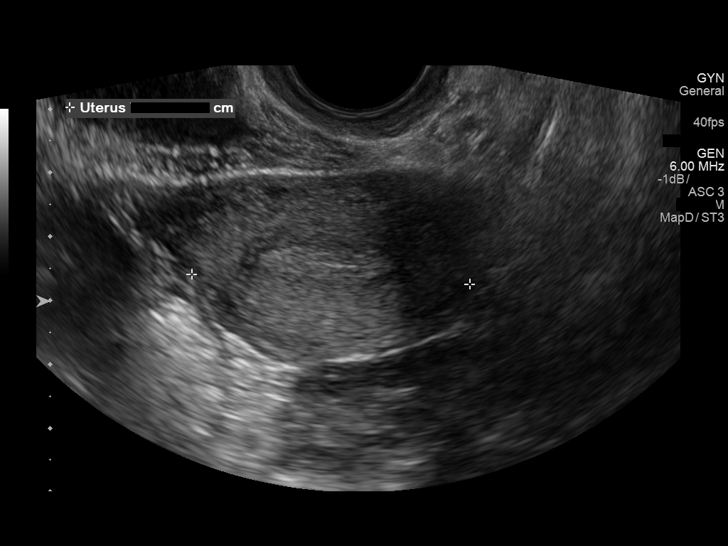
[im 71/95]
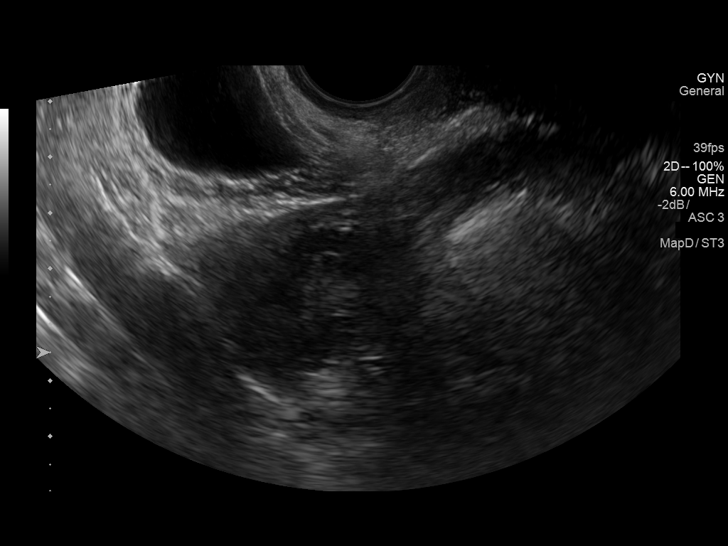
[im 79/95]
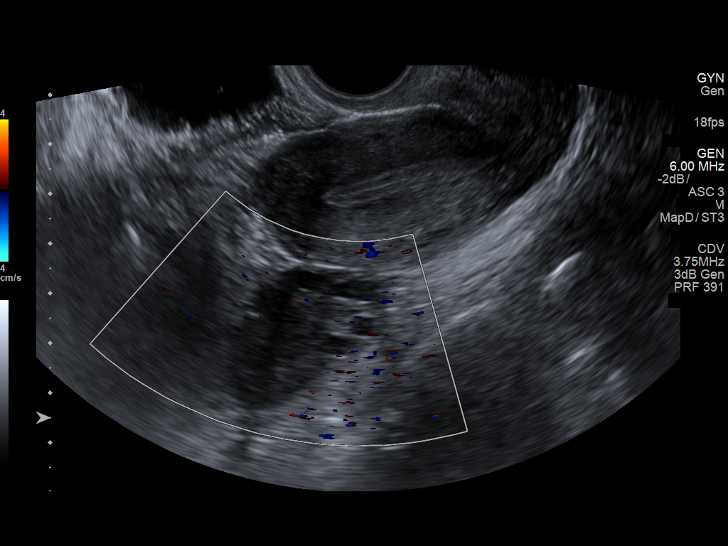
[im 87/95]
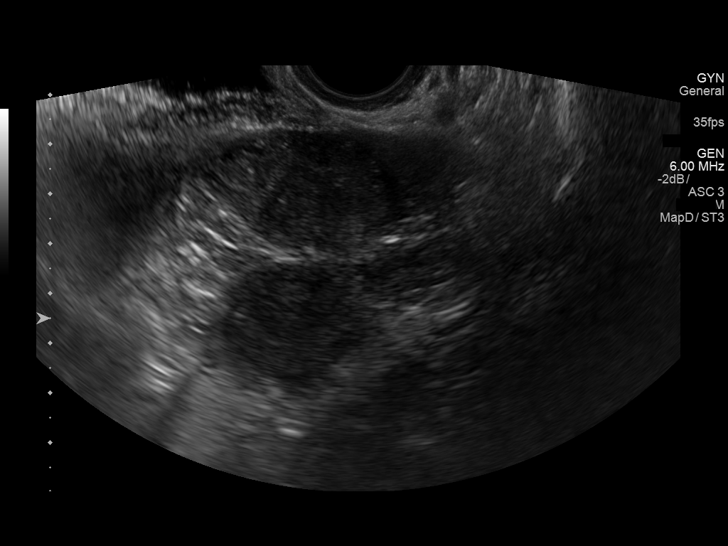
[im 95/95]
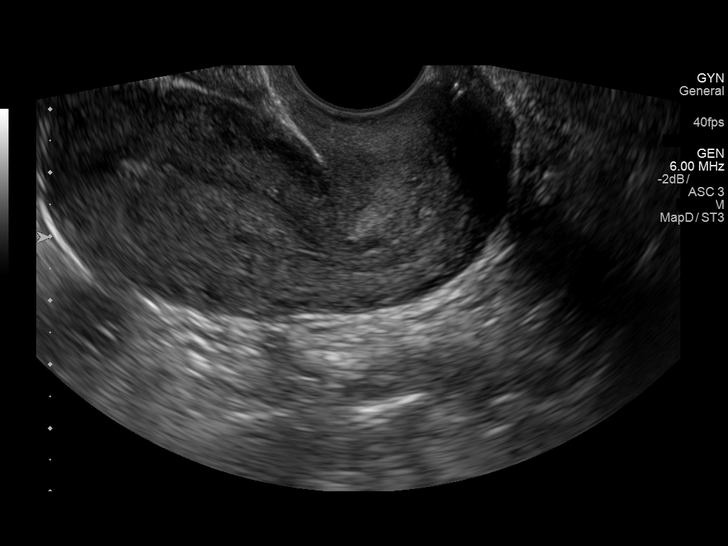

[14 of 25 positions shown; findings below may reference images not displayed]

FINDINGS: Uterus

Measurements: 6.8 x 3.1 x 4.4 cm.. No fibroids or other mass
visualized.

Endometrium

Thickness: 4.9 mm..  No focal abnormality visualized.

Right ovary

Measurements: 3.7 x 2.6 x 3.5 cm.. Normal appearance/no adnexal
mass.

Left ovary

Measurements: 3.8 x 2.5 x 3.1 cm.. Normal appearance/no adnexal
mass.

Other findings

No abnormal free fluid.
IMPRESSION: 1. No acute abnormalities identified. Doppler imaging of the ovaries
was not requested.

## 2017-03-29 ENCOUNTER — Emergency Department (HOSPITAL_COMMUNITY): Payer: Self-pay

## 2017-03-29 ENCOUNTER — Encounter (HOSPITAL_COMMUNITY): Payer: Self-pay

## 2017-03-29 ENCOUNTER — Emergency Department (HOSPITAL_COMMUNITY)
Admission: EM | Admit: 2017-03-29 | Discharge: 2017-03-29 | Disposition: A | Discharge status: Home / Self Care | Payer: Self-pay | Attending: Emergency Medicine | Admitting: Emergency Medicine

## 2017-03-29 DIAGNOSIS — L03011 Cellulitis of right finger: Secondary | ICD-10-CM | POA: Insufficient documentation

## 2017-03-29 DIAGNOSIS — F17200 Nicotine dependence, unspecified, uncomplicated: Secondary | ICD-10-CM | POA: Insufficient documentation

## 2017-03-29 DIAGNOSIS — Z79899 Other long term (current) drug therapy: Secondary | ICD-10-CM | POA: Insufficient documentation

## 2017-03-29 DIAGNOSIS — Z23 Encounter for immunization: Secondary | ICD-10-CM | POA: Insufficient documentation

## 2017-03-29 LAB — POC URINE PREG, ED: Preg Test, Ur: NEGATIVE

## 2017-03-29 MED ORDER — TETANUS-DIPHTH-ACELL PERTUSSIS 5-2.5-18.5 LF-MCG/0.5 IM SUSP
0.5000 mL | Freq: Once | INTRAMUSCULAR | Status: AC
  Administered 2017-03-29: 0.5 mL via INTRAMUSCULAR
  Filled 2017-03-29: qty 0.5

## 2017-03-29 MED ORDER — DOXYCYCLINE HYCLATE 100 MG PO CAPS: 100.0000 mg | ORAL_CAPSULE | Freq: Two times a day (BID) | ORAL | 0 refills | Status: DC

## 2017-03-29 MED ORDER — IBUPROFEN 600 MG PO TABS: 600.0000 mg | ORAL_TABLET | Freq: Four times a day (QID) | ORAL | 0 refills | Status: AC | PRN

## 2017-03-29 NOTE — ED Triage Notes (Signed)
Pt presents for evaluation of R first finger pain x 3-4 days. Pt reports had acrylic nail break and went to have nail removed. During removal finger was cut and another nail was placed on. Pt had second acrylic nail removed x 2 days ago. Reports severe pain to area with warmth and redness. Denies fever at home.

## 2017-03-29 NOTE — Discharge Instructions (Signed)
You have been diagnosed with cellulitis of the finger.  Please take antibiotic as prescribe.  Take ibuprofen for pain.  Soak finger in warm salt water several times daily.  Return in 48 hrs for recheck if no improvement.

## 2017-03-29 NOTE — ED Notes (Signed)
Declined W/C at D/C and was escorted to lobby by RN. 

## 2017-03-29 NOTE — ED Provider Notes (Signed)
[facility]-EMERGENCY DEPT Provider Note   CSN: [id number] Arrival date & time: 03/29/17  1002     History   Chief Complaint Chief Complaint  Patient presents with  . Hand Pain    HPI Angel Rojas is a 26 y.o. female.  HPI   26 year old female presenting for evaluation of finger infection. Patient state she had acrylic nails most recently worked on at a [employer]. Her acrylic nail on the R finger broke off several days ago.  She had it replaced but soon after she developed sharp throbbing pain to the affected finger.  She did remembered having a small bleed during the procedure.  Now her finger is throbbing and persistent.  No fever, no numbness.  She is R hand dominant. Currently not pregnant.   Past Medical History:  Diagnosis Date  . Bilateral ovarian cysts     There are no active problems to display for this patient.   Past Surgical History:  Procedure Laterality Date  . DILATION AND CURETTAGE OF UTERUS      OB History    Gravida Para Term Preterm AB Living   1       1     SAB TAB Ectopic Multiple Live Births   1       0       Home Medications    Prior to Admission medications   Medication Sig Start Date End Date Taking? Authorizing Provider  diphenhydrAMINE-zinc acetate (BENADRYL EXTRA STRENGTH) cream Apply 1 application topically 3 (three) times daily as needed for itching. 06/29/16   Aviva Signs, CNM  hydrOXYzine (ATARAX/VISTARIL) 25 MG tablet Take 1 tablet (25 mg total) by mouth every 6 (six) hours. 01/04/17   Geoffery Lyons, MD  loratadine (CLARITIN) 10 MG tablet Take 1 tablet (10 mg total) by mouth daily. 06/30/16   Aviva Signs, CNM  metroNIDAZOLE (FLAGYL) 500 MG tablet Take 1 tablet (500 mg total) by mouth 2 (two) times daily. 01/07/16   Focht, Joyce Copa, PA  ofloxacin (OCUFLOX) 0.3 % ophthalmic solution Place 1 drop into the right ear 2 (two) times daily. Until better 08/29/16   Derwood Kaplan, MD  ondansetron (ZOFRAN ODT) 8 MG disintegrating  tablet Take 1 tablet (8 mg total) by mouth every 8 (eight) hours as needed for nausea or vomiting. 08/09/16   Margarita Grizzle, MD  predniSONE (DELTASONE) 10 MG tablet Take 2 tablets (20 mg total) by mouth 2 (two) times daily. 01/04/17   Geoffery Lyons, MD    Family History No family history on file.  Social History Social History  Substance Use Topics  . Smoking status: Current Some Day Smoker  . Smokeless tobacco: Never Used  . Alcohol use Yes     Allergies   Patient has no known allergies.   Review of Systems Review of Systems  Constitutional: Negative for fever.  Skin: Positive for rash and wound.     Physical Exam Updated Vital Signs BP 138/90 (BP Location: Right Arm)   Pulse 88   Temp 97.8 F (36.6 C) (Oral)   Resp 16   LMP 03/07/2017 (Within Days)   SpO2 100%   Physical Exam  Constitutional: She appears well-developed and well-nourished. No distress.  HENT:  Head: Atraumatic.  Eyes: Conjunctivae are normal.  Neck: Neck supple.  Musculoskeletal: She exhibits tenderness (R index finger: distal phalanx is red, warm, swollen and moderately tender.  nail is missing, tenderness to nail bed, no obvious abscess noted.  brisk cap refill.  able to flex/extend at PIP and DIP joint.).  Neurological: She is alert.  Skin: No rash noted.  Psychiatric: She has a normal mood and affect.  Nursing note and vitals reviewed.    ED Treatments / Results  Labs (all labs ordered are listed, but only abnormal results are displayed) Labs Reviewed - No data to display  EKG  EKG Interpretation None       Radiology Dg Finger Index Right  Result Date: 03/29/2017 CLINICAL DATA:  Full infection of the distal aspect of the index finger after eating laceration 1 week ago. The distal finger is swollen and erythematous. EXAM: RIGHT INDEX FINGER 2+V COMPARISON:  None in PACs FINDINGS: The soft tissues of the index finger are mildly swollen. There are no abnormal soft tissue gas  collections. The distal phalanx exhibits no bony abnormality. The DIP joint is normal. More proximally the proximal and middle phalanges and the PIP joint are normal. IMPRESSION: There is soft tissue swelling of the distal phalanx that likely reflect cellulitis. No radiopaque foreign body is observed. No radiographic evidence of osteomyelitis. Electronically Signed   By: David  [place] M.D.   On: 03/29/2017 10:58    Procedures Procedures (including critical care time)  Medications Ordered in ED Medications  Tdap (BOOSTRIX) injection 0.5 mL (not administered)     Initial Impression / Assessment and Plan / ED Course  I have reviewed the triage vital signs and the nursing notes.  Pertinent labs & imaging results that were available during my care of the patient were reviewed by me and considered in my medical decision making (see chart for details).     BP 138/90 (BP Location: Right Arm)   Pulse 88   Temp 97.8 F (36.6 C) (Oral)   Resp 16   LMP 03/07/2017 (Within Days)   SpO2 100%    Final Clinical Impressions(s) / ED Diagnoses   Final diagnoses:  Cellulitis of right index finger    New Prescriptions New Prescriptions   DOXYCYCLINE (VIBRAMYCIN) 100 MG CAPSULE    Take 1 capsule (100 mg total) by mouth 2 (two) times daily. One po bid x 7 days   IBUPROFEN (ADVIL,MOTRIN) 600 MG TABLET    Take 1 tablet (600 mg total) by mouth every 6 (six) hours as needed.   11:33 AM Pt here with infection to her R index finger from recent removal of acrylic nail.  Xray without evidence of osteomyelitis.  Currently it appears she has cellulitis, potential early abscess however not amenable for drainage at this time.  Will give tdap, start pt on doxy and ibuprofen.  Recommend return in 48 hrs for recheck.     Fayrene Helper, PA-C 03/29/17 1137    Benjiman Core, MD 03/29/17 [phone]

## 2017-03-29 NOTE — ED Notes (Signed)
Patient returned from xray.

## 2017-04-02 ENCOUNTER — Encounter (HOSPITAL_COMMUNITY): Payer: Self-pay | Admitting: *Deleted

## 2017-04-02 ENCOUNTER — Emergency Department (HOSPITAL_COMMUNITY)
Admission: EM | Admit: 2017-04-02 | Discharge: 2017-04-02 | Disposition: A | Discharge status: Home / Self Care | Payer: Self-pay | Attending: Emergency Medicine | Admitting: Emergency Medicine

## 2017-04-02 DIAGNOSIS — Z79899 Other long term (current) drug therapy: Secondary | ICD-10-CM | POA: Insufficient documentation

## 2017-04-02 DIAGNOSIS — L03011 Cellulitis of right finger: Secondary | ICD-10-CM | POA: Insufficient documentation

## 2017-04-02 DIAGNOSIS — F17200 Nicotine dependence, unspecified, uncomplicated: Secondary | ICD-10-CM | POA: Insufficient documentation

## 2017-04-02 MED ORDER — HYDROCODONE-ACETAMINOPHEN 5-325 MG PO TABS: 1.0000 | ORAL_TABLET | ORAL | 0 refills | Status: AC | PRN

## 2017-04-02 MED ORDER — LIDOCAINE HCL (PF) 1 % IJ SOLN
5.0000 mL | Freq: Once | INTRAMUSCULAR | Status: AC
  Administered 2017-04-02: 5 mL
  Filled 2017-04-02: qty 30

## 2017-04-02 MED ORDER — TETANUS-DIPHTH-ACELL PERTUSSIS 5-2.5-18.5 LF-MCG/0.5 IM SUSP
0.5000 mL | Freq: Once | INTRAMUSCULAR | Status: DC
  Filled 2017-04-02: qty 0.5

## 2017-04-02 MED ORDER — OXYCODONE-ACETAMINOPHEN 5-325 MG PO TABS
1.0000 | ORAL_TABLET | Freq: Once | ORAL | Status: AC
  Administered 2017-04-02: 1 via ORAL
  Filled 2017-04-02: qty 1

## 2017-04-02 NOTE — ED Notes (Signed)
Hope, NP at bedside. She is performing an I&D. Patient tolerating it fair to well.

## 2017-04-02 NOTE — Discharge Instructions (Signed)
Call Dr. Ronie Spies office in the morning and tell them you were seen in the ED and we spoke with him and he wants to see you in the office tomorrow. He will remove the packing and decide if additional treatment is needed.   Continue your antibiotics. Do not drive while taking the pain medication.

## 2017-04-02 NOTE — ED Notes (Signed)
Provided a warm blanket.

## 2017-04-02 NOTE — ED Provider Notes (Signed)
[facility]-EMERGENCY DEPT Provider Note   CSN: [id number] Arrival date & time: 04/02/17  1742     History   Chief Complaint Chief Complaint  Patient presents with  . Hand Pain    index finger    HPI Angel Rojas is a 26 y.o. female who presents to the ED for recheck of infected finger. Patient reports that she was seen in the [facility] 4 days ago and prescribed antibiotics and since the the pain and swelling has gotten worse. Initially she had an acrylic nail that broke off and had it repaired and the pain and swelling started after that. Patient denies fever or chills or any other problems.    HPI  Past Medical History:  Diagnosis Date  . Bilateral ovarian cysts     There are no active problems to display for this patient.   Past Surgical History:  Procedure Laterality Date  . DILATION AND CURETTAGE OF UTERUS      OB History    Gravida Para Term Preterm AB Living   1       1     SAB TAB Ectopic Multiple Live Births   1       0       Home Medications    Prior to Admission medications   Medication Sig Start Date End Date Taking? Authorizing Provider  diphenhydrAMINE-zinc acetate (BENADRYL EXTRA STRENGTH) cream Apply 1 application topically 3 (three) times daily as needed for itching. 06/29/16   Aviva Signs, CNM  doxycycline (VIBRAMYCIN) 100 MG capsule Take 1 capsule (100 mg total) by mouth 2 (two) times daily. One po bid x 7 days 03/29/17   Fayrene Helper, PA-C  HYDROcodone-acetaminophen (NORCO/VICODIN) 5-325 MG tablet Take 1 tablet by mouth every 4 (four) hours as needed. 04/02/17   Janne Napoleon, NP  hydrOXYzine (ATARAX/VISTARIL) 25 MG tablet Take 1 tablet (25 mg total) by mouth every 6 (six) hours. 01/04/17   Geoffery Lyons, MD  ibuprofen (ADVIL,MOTRIN) 600 MG tablet Take 1 tablet (600 mg total) by mouth every 6 (six) hours as needed. 03/29/17   Fayrene Helper, PA-C  loratadine (CLARITIN) 10 MG tablet Take 1 tablet (10 mg total) by mouth daily. 06/30/16   Aviva Signs,  CNM  metroNIDAZOLE (FLAGYL) 500 MG tablet Take 1 tablet (500 mg total) by mouth 2 (two) times daily. 01/07/16   Focht, Joyce Copa, PA  ofloxacin (OCUFLOX) 0.3 % ophthalmic solution Place 1 drop into the right ear 2 (two) times daily. Until better 08/29/16   Derwood Kaplan, MD  ondansetron (ZOFRAN ODT) 8 MG disintegrating tablet Take 1 tablet (8 mg total) by mouth every 8 (eight) hours as needed for nausea or vomiting. 08/09/16   Margarita Grizzle, MD  predniSONE (DELTASONE) 10 MG tablet Take 2 tablets (20 mg total) by mouth 2 (two) times daily. 01/04/17   Geoffery Lyons, MD    Family History No family history on file.  Social History Social History  Substance Use Topics  . Smoking status: Current Some Day Smoker  . Smokeless tobacco: Never Used  . Alcohol use Yes     Allergies   Patient has no known allergies.   Review of Systems Review of Systems  Constitutional: Negative for chills and fever.  HENT: Negative.   Gastrointestinal: Negative for nausea and vomiting.  Musculoskeletal: Positive for arthralgias.       Finger  Skin: Positive for wound.  Neurological: Negative for headaches.  Psychiatric/Behavioral: Negative for confusion.  Physical Exam Updated Vital Signs BP (!) 125/97 (BP Location: Left Arm)   Pulse 91   Temp 99.5 F (37.5 C) (Oral)   Resp 18   LMP 03/07/2017 (Within Days)   SpO2 97%   Physical Exam  Constitutional: She is oriented to person, place, and time. She appears well-developed and well-nourished.  Eyes: EOM are normal.  Neck: Neck supple.  Cardiovascular: Tachycardia present.   Pulmonary/Chest: Effort normal.  Musculoskeletal:       Hands: Right index finger with swelling and tenderness to the distal aspect.   Neurological: She is alert and oriented to person, place, and time. No cranial nerve deficit.  Skin: Skin is warm and dry.  Psychiatric: She has a normal mood and affect. Her behavior is normal.  Nursing note and vitals reviewed.    ED  Treatments / Results  Labs (all labs ordered are listed, but only abnormal results are displayed) Labs Reviewed  AEROBIC/ANAEROBIC CULTURE (SURGICAL/DEEP WOUND)     Radiology No results found.  Procedures .Marland KitchenIncision and Drainage Date/Time: 04/02/2017 8:01 PM Performed by: Janne Napoleon Authorized by: Janne Napoleon   Consent:    Consent obtained:  Verbal   Consent given by:  Patient   Risks discussed:  Bleeding and pain   Alternatives discussed:  No treatment and referral Location:    Indications for incision and drainage: paronychia.   Location:  Upper extremity   Upper extremity location:  Finger   Finger location:  R index finger Pre-procedure details:    Skin preparation:  Betadine Anesthesia (see MAR for exact dosages):    Anesthesia method:  Nerve block   Block needle gauge:  27 G   Block anesthetic:  Lidocaine 1% w/o epi   Block injection procedure:  Anatomic landmarks identified, introduced needle, incremental injection and negative aspiration for blood (digital block)   Block outcome:  Anesthesia achieved Procedure type:    Complexity:  Complex Procedure details:    Needle aspiration: no     Incision types:  Single straight   Incision depth:  Dermal   Scalpel blade:  11   Wound management:  Probed and deloculated and irrigated with saline   Drainage:  Purulent   Drainage amount:  Moderate   Packing materials:  1/4 in gauze Post-procedure details:    Patient tolerance of procedure:  Tolerated well, no immediate complications Comments:     Tetanus up to dated      (including critical care time)  Medications Ordered in ED Medications  lidocaine (PF) (XYLOCAINE) 1 % injection 5 mL (5 mLs Infiltration Given by Other 04/02/17 1935)  oxyCODONE-acetaminophen (PERCOCET/ROXICET) 5-325 MG per tablet 1 tablet (1 tablet Oral Given 04/02/17 2007)   Consult with Dr. Mina Marble. Will send culture and patient to continue antibiotics and f/u in the office tomorrow.    Initial Impression / Assessment and Plan / ED Course  I have reviewed the triage vital signs and the nursing notes.   Final Clinical Impressions(s) / ED Diagnoses  26 y.o. female with swelling and pain to the distal aspect of the right index finger stable for d/c without fever or red streaking. Patient to continue her antibiotics and f/u with Dr. Mina Marble in the office in the morning. Pain controlled in the ED.   Final diagnoses:  Felon of finger of right hand    New Prescriptions Discharge Medication List as of 04/02/2017  8:48 PM    START taking these medications   Details  HYDROcodone-acetaminophen (NORCO/VICODIN)  5-325 MG tablet Take 1 tablet by mouth every 4 (four) hours as needed., Starting Mon 04/02/2017, Print         [place], [place], NP 04/02/17 2317    Rolan Bucco, MD 04/02/17 2322

## 2017-04-02 NOTE — ED Triage Notes (Signed)
Pt here for reevaluation of right index finger. Pt was seen at [facility] 4 days ago for finger pain, prescribed antibiotics and told to return if her symptoms did not improve. Pt states finger pain and swelling are now worse.

## 2017-04-02 NOTE — ED Notes (Signed)
Provider at bedside

## 2017-04-08 LAB — AEROBIC/ANAEROBIC CULTURE (SURGICAL/DEEP WOUND)

## 2017-04-08 LAB — AEROBIC/ANAEROBIC CULTURE W GRAM STAIN (SURGICAL/DEEP WOUND)

## 2017-04-09 ENCOUNTER — Telehealth: Payer: Self-pay | Admitting: *Deleted

## 2017-04-09 NOTE — Telephone Encounter (Signed)
Post ED Visit - Positive Culture Follow-up  Culture report reviewed by antimicrobial stewardship pharmacist:  []  Enzo BiNathan Batchelder, Pharm.D. []  Celedonio MiyamotoJeremy Frens, Pharm.D., BCPS AQ-ID []  Garvin FilaMike Maccia, Pharm.D., BCPS []  Georgina PillionElizabeth Martin, Pharm.D., BCPS []  FennvilleMinh Pham, [address] Rainbow BoulevardPharm.D., BCPS, AAHIVP []  Estella HuskMichelle Turner, Pharm.D., BCPS, AAHIVP []  Lysle Pearlachel Rumbarger, PharmD, BCPS []  Casilda Carlsaylor Stone, PharmD, BCPS []  Pollyann SamplesAndy Johnston, PharmD, BCPS  Positive wound culture, reviewed by Azucena Kubayler Leaphart, PA-C.  Per patient, she is currently being followed by Dr. Mina MarbleWeingold for her finger wound as instructed.  Virl AxeRobertson, Moe [facility] Cent[facility] 04/09/2017, 10:40 AM

## 2019-03-31 ENCOUNTER — Emergency Department (HOSPITAL_COMMUNITY)
Admission: EM | Admit: 2019-03-31 | Discharge: 2019-03-31 | Disposition: A | Discharge status: Home / Self Care | Payer: Self-pay | Attending: Emergency Medicine | Admitting: Emergency Medicine

## 2019-03-31 ENCOUNTER — Encounter (HOSPITAL_COMMUNITY): Payer: Self-pay | Admitting: Emergency Medicine

## 2019-03-31 ENCOUNTER — Other Ambulatory Visit: Payer: Self-pay

## 2019-03-31 DIAGNOSIS — L0291 Cutaneous abscess, unspecified: Secondary | ICD-10-CM

## 2019-03-31 DIAGNOSIS — F17210 Nicotine dependence, cigarettes, uncomplicated: Secondary | ICD-10-CM | POA: Insufficient documentation

## 2019-03-31 DIAGNOSIS — L02416 Cutaneous abscess of left lower limb: Secondary | ICD-10-CM | POA: Insufficient documentation

## 2019-03-31 MED ORDER — DOXYCYCLINE HYCLATE 100 MG PO CAPS: 100.0000 mg | ORAL_CAPSULE | Freq: Two times a day (BID) | ORAL | 0 refills | Status: AC

## 2019-03-31 NOTE — ED Triage Notes (Signed)
Patient reports abscess to L hip x 1 week, worse since yesterday, with yellow/green discharge and surrounding redness. Denies fevers/chills.

## 2019-03-31 NOTE — ED Provider Notes (Signed)
[facility] EMERGENCY DEPARTMENT Provider Note   CSN: [id number] Arrival date & time: 03/31/19  3785     History   Chief Complaint Chief Complaint  Patient presents with  . Abscess    HPI Jinny Sweetland is a 28 y.o. female.     HPI   28 year old female presents today with abscess to her left hip.  She notes approximate 1 week ago she had a red bump there and had an ingrown hair which she removed.  Since that time she had worsening redness swelling and discharge from the area.  She denies any fever or systemic symptoms.  She notes she did have one previously on her buttocks that required incision and drainage.  She is not pregnant or breast-feeding.  Past Medical History:  Diagnosis Date  . Bilateral ovarian cysts     There are no active problems to display for this patient.   Past Surgical History:  Procedure Laterality Date  . DILATION AND CURETTAGE OF UTERUS       OB History    Gravida  1   Para      Term      Preterm      AB  1   Living        SAB  1   TAB      Ectopic      Multiple      Live Births  0            Home Medications    Prior to Admission medications   Medication Sig Start Date End Date Taking? Authorizing Provider  diphenhydrAMINE-zinc acetate (BENADRYL EXTRA STRENGTH) cream Apply 1 application topically 3 (three) times daily as needed for itching. 06/29/16   Seabron Spates, CNM  doxycycline (VIBRAMYCIN) 100 MG capsule Take 1 capsule (100 mg total) by mouth 2 (two) times daily. 03/31/19   Novak Stgermaine, Dellis Filbert, PA-C  HYDROcodone-acetaminophen (NORCO/VICODIN) 5-325 MG tablet Take 1 tablet by mouth every 4 (four) hours as needed. 04/02/17   Ashley Murrain, NP  hydrOXYzine (ATARAX/VISTARIL) 25 MG tablet Take 1 tablet (25 mg total) by mouth every 6 (six) hours. 01/04/17   Veryl Speak, MD  ibuprofen (ADVIL,MOTRIN) 600 MG tablet Take 1 tablet (600 mg total) by mouth every 6 (six) hours as needed. 03/29/17   Domenic Moras, PA-C  loratadine (CLARITIN) 10 MG tablet Take 1 tablet (10 mg total) by mouth daily. 06/30/16   Seabron Spates, CNM  metroNIDAZOLE (FLAGYL) 500 MG tablet Take 1 tablet (500 mg total) by mouth 2 (two) times daily. 01/07/16   Focht, Fraser Din, PA  ofloxacin (OCUFLOX) 0.3 % ophthalmic solution Place 1 drop into the right ear 2 (two) times daily. Until better 08/29/16   Varney Biles, MD  ondansetron (ZOFRAN ODT) 8 MG disintegrating tablet Take 1 tablet (8 mg total) by mouth every 8 (eight) hours as needed for nausea or vomiting. 08/09/16   Pattricia Boss, MD  predniSONE (DELTASONE) 10 MG tablet Take 2 tablets (20 mg total) by mouth 2 (two) times daily. 01/04/17   Veryl Speak, MD    Family History No family history on file.  Social History Social History   Tobacco Use  . Smoking status: Current Some Day Smoker  . Smokeless tobacco: Never Used  Substance Use Topics  . Alcohol use: Yes    Comment: social  . Drug use: No     Allergies   Patient has no known allergies.   Review of  Systems Review of Systems  All other systems reviewed and are negative.   Physical Exam Updated Vital Signs BP 120/90 (BP Location: Right Arm)   Pulse 95   Temp 98.4 F (36.9 C) (Oral)   Resp 16   LMP 02/12/2019 (Approximate) Comment: irregular periods  SpO2 100%   Physical Exam Vitals signs and nursing note reviewed.  Constitutional:      Appearance: She is well-developed.  HENT:     Head: Normocephalic and atraumatic.  Eyes:     General: No scleral icterus.       Right eye: No discharge.        Left eye: No discharge.     Conjunctiva/sclera: Conjunctivae normal.     Pupils: Pupils are equal, round, and reactive to light.  Neck:     Musculoskeletal: Normal range of motion.     Vascular: No JVD.     Trachea: No tracheal deviation.  Pulmonary:     Effort: Pulmonary effort is normal.     Breath sounds: No stridor.  Skin:    Comments: 5 cm area of erythema with 1.5 cm central  induration with drainage  Neurological:     Mental Status: She is alert and oriented to person, place, and time.     Coordination: Coordination normal.  Psychiatric:        Behavior: Behavior normal.        Thought Content: Thought content normal.        Judgment: Judgment normal.     ED Treatments / Results  Labs (all labs ordered are listed, but only abnormal results are displayed) Labs Reviewed - No data to display  EKG None  Radiology No results found.  Procedures Procedures (including critical care time)  Medications Ordered in ED Medications - No data to display   Initial Impression / Assessment and Plan / ED Course  I have reviewed the triage vital signs and the nursing notes.  Pertinent labs & imaging results that were available during my care of the patient were reviewed by me and considered in my medical decision making (see chart for details).       28 year old female presents today with draining abscess.  No fluid collection amenable to I&D, patient placed on doxycycline, discharged with symptomatic care instructions strict return precautions.  She verbalized understanding and agreement to this plan had no further questions or concerns at the time of discharge.  Final Clinical Impressions(s) / ED Diagnoses   Final diagnoses:  Abscess    ED Discharge Orders         Ordered    doxycycline (VIBRAMYCIN) 100 MG capsule  2 times daily     03/31/19 1058           Eyvonne MechanicHedges, Tsering Leaman, PA-C 03/31/19 1113    Tilden Fossaees, Elizabeth, MD 04/01/19 [phone]

## 2019-03-31 NOTE — ED Notes (Signed)
Pt verbalizes understanding of d/c instructions. Prescriptions reviewed with patient. Pt ambulatory at d/c with all belongings.  

## 2019-03-31 NOTE — Discharge Instructions (Signed)
Please read attached information. If you experience any new or worsening signs or symptoms please return to the emergency room for evaluation. Please follow-up with your primary care provider or specialist as discussed. Please use medication prescribed only as directed and discontinue taking if you have any concerning signs or symptoms.   °

## 2019-04-09 IMAGING — DX DG FINGER INDEX 2+V*R*
3 series · 3 of 3 positions shown · non-contrast
Comparison: None in PACs

CLINICAL DATA: Full infection of the distal aspect of the index
finger after eating laceration 1 week ago. The distal finger is
swollen and erythematous.

EXAM:
RIGHT INDEX FINGER 2+V

[finger ap]
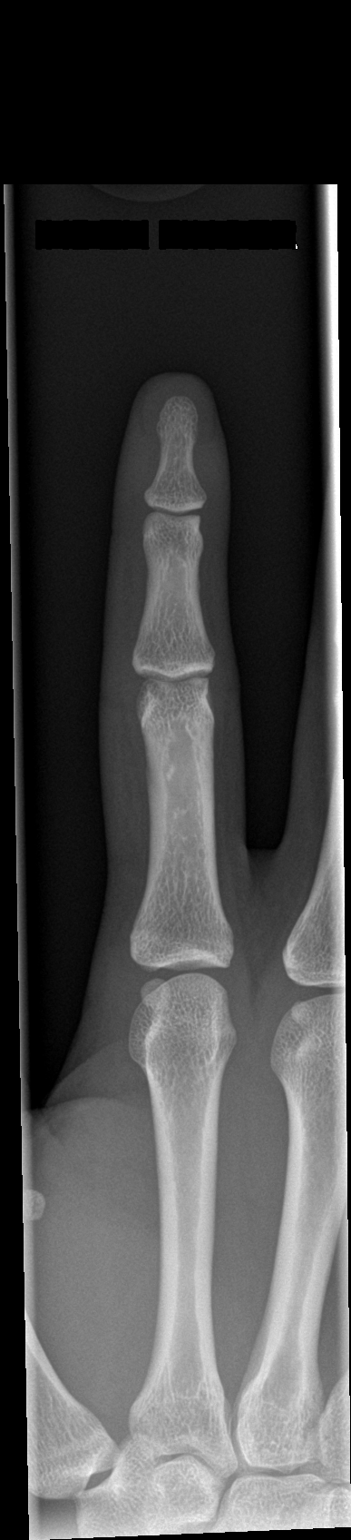

[finger obl]
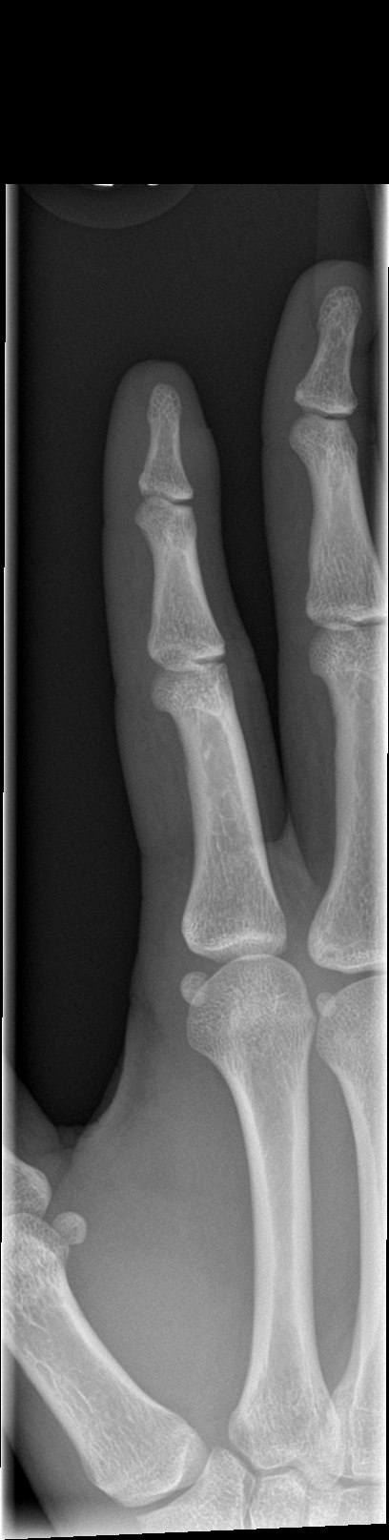

[finger lat]
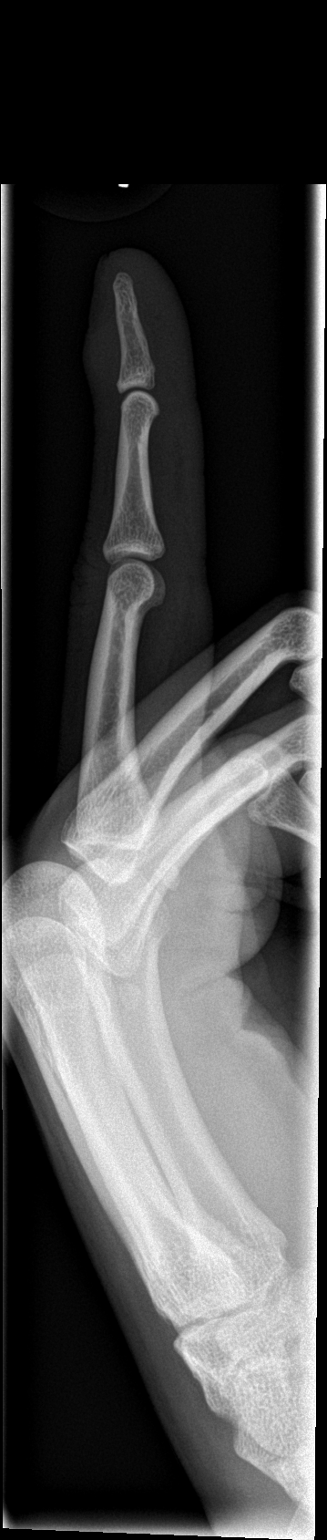

[3 of 3 positions shown; findings below may reference images not displayed]

FINDINGS: The soft tissues of the index finger are mildly swollen. There are
no abnormal soft tissue gas collections. The distal phalanx exhibits
no bony abnormality. The DIP joint is normal. More proximally the
proximal and middle phalanges and the PIP joint are normal.
IMPRESSION: There is soft tissue swelling of the distal phalanx that likely
reflect cellulitis. No radiopaque foreign body is observed. No
radiographic evidence of osteomyelitis.

## 2019-05-15 ENCOUNTER — Other Ambulatory Visit: Payer: Self-pay

## 2019-05-15 ENCOUNTER — Emergency Department (HOSPITAL_COMMUNITY)
Admission: EM | Admit: 2019-05-15 | Discharge: 2019-05-16 | Disposition: A | Discharge status: Home / Self Care | Payer: Self-pay | Attending: Emergency Medicine | Admitting: Emergency Medicine

## 2019-05-15 DIAGNOSIS — Z202 Contact with and (suspected) exposure to infections with a predominantly sexual mode of transmission: Secondary | ICD-10-CM

## 2019-05-15 DIAGNOSIS — F17210 Nicotine dependence, cigarettes, uncomplicated: Secondary | ICD-10-CM | POA: Insufficient documentation

## 2019-05-15 DIAGNOSIS — N939 Abnormal uterine and vaginal bleeding, unspecified: Secondary | ICD-10-CM

## 2019-05-15 DIAGNOSIS — Z79899 Other long term (current) drug therapy: Secondary | ICD-10-CM | POA: Insufficient documentation

## 2019-05-15 LAB — I-STAT BETA HCG BLOOD, ED (MC, WL, AP ONLY): I-stat hCG, quantitative: <5 m[IU]/mL (ref <5)

## 2019-05-15 NOTE — ED Triage Notes (Addendum)
Pt states she has had vaginal bleeding for 3-4 weeks, she thought her cycle finished but it came back and has had heavy vaginal bleeding for the past 2 weeks. Pt also was notified today that she has has an STD exposure but denies any symptoms.

## 2019-05-16 LAB — CBC WITH DIFFERENTIAL/PLATELET
Abs Immature Granulocytes: 0.05 10*3/uL (ref 0.00–0.07)
Basophils Absolute: 0.1 10*3/uL (ref 0.0–0.1)
Basophils Relative: 1 %
Eosinophils Absolute: 0.1 10*3/uL (ref 0.0–0.5)
Eosinophils Relative: 2 %
HCT: 38.1 % (ref 36.0–46.0)
Hemoglobin: 13.4 g/dL (ref 12.0–15.0)
Immature Granulocytes: 1 %
Lymphocytes Relative: 26 %
Lymphs Abs: 2.2 10*3/uL (ref 0.7–4.0)
MCH: 29.8 pg (ref 26.0–34.0)
MCHC: 35.2 g/dL (ref 30.0–36.0)
MCV: 84.9 fL (ref 80.0–100.0)
Monocytes Absolute: 0.7 10*3/uL (ref 0.1–1.0)
Monocytes Relative: 8 %
Neutro Abs: 5.5 10*3/uL (ref 1.7–7.7)
Neutrophils Relative %: 62 %
Platelets: 297 10*3/uL (ref 150–400)
RBC: 4.49 MIL/uL (ref 3.87–5.11)
RDW: 13.3 % (ref 11.5–15.5)
WBC: 8.7 10*3/uL (ref 4.0–10.5)
nRBC: 0 % (ref 0.0–0.2)

## 2019-05-16 LAB — WET PREP, GENITAL
Clue Cells Wet Prep HPF POC: NONE SEEN
Sperm: NONE SEEN
WBC, Wet Prep HPF POC: NONE SEEN
Yeast Wet Prep HPF POC: NONE SEEN

## 2019-05-16 LAB — BASIC METABOLIC PANEL
Anion gap: 11 (ref 5–15)
BUN: 6 mg/dL (ref 6–20)
CO2: 23 mmol/L (ref 22–32)
Calcium: 9.7 mg/dL (ref 8.9–10.3)
Chloride: 104 mmol/L (ref 98–111)
Creatinine, Ser: 0.82 mg/dL (ref 0.44–1.00)
GFR calc Af Amer: >60 mL/min (ref >60)
GFR calc non Af Amer: >60 mL/min (ref >60)
Glucose, Bld: 98 mg/dL (ref 70–99)
Potassium: 3.6 mmol/L (ref 3.5–5.1)
Sodium: 138 mmol/L (ref 135–145)

## 2019-05-16 LAB — HIV ANTIBODY (ROUTINE TESTING W REFLEX): HIV Screen 4th Generation wRfx: NONREACTIVE

## 2019-05-16 LAB — RPR: RPR Ser Ql: NONREACTIVE

## 2019-05-16 MED ORDER — AZITHROMYCIN 250 MG PO TABS
1000.0000 mg | ORAL_TABLET | Freq: Once | ORAL | Status: AC
  Administered 2019-05-16: 1000 mg via ORAL
  Filled 2019-05-16: qty 4

## 2019-05-16 MED ORDER — METRONIDAZOLE 500 MG PO TABS
2000.0000 mg | ORAL_TABLET | Freq: Once | ORAL | Status: AC
  Administered 2019-05-16: 2000 mg via ORAL
  Filled 2019-05-16: qty 4

## 2019-05-16 MED ORDER — ONDANSETRON 4 MG PO TBDP
4.0000 mg | ORAL_TABLET | Freq: Once | ORAL | Status: AC
  Administered 2019-05-16: 4 mg via ORAL
  Filled 2019-05-16: qty 1

## 2019-05-16 MED ORDER — LIDOCAINE HCL (PF) 1 % IJ SOLN
5.0000 mL | Freq: Once | INTRAMUSCULAR | Status: AC
  Administered 2019-05-16: 06:00:00 5 mL via INTRADERMAL
  Filled 2019-05-16: qty 5

## 2019-05-16 MED ORDER — CEFTRIAXONE SODIUM 250 MG IJ SOLR
250.0000 mg | Freq: Once | INTRAMUSCULAR | Status: AC
  Administered 2019-05-16: 250 mg via INTRAMUSCULAR
  Filled 2019-05-16: qty 250

## 2019-05-16 NOTE — Discharge Instructions (Signed)
1. Medications: usual home medications 2. Treatment: rest, drink plenty of fluids, use a condom with every sexual encounter.  Do not drink alcohol with the medications you were given today. 3. Follow Up: Please followup with your primary doctor in 3 days for discussion of your diagnoses and further evaluation after today's visit; if you do not have a primary care doctor use the resource guide provided to find one; Please return to the ER for worsening symptoms, high fevers or persistent vomiting.  You have been tested for HIV, syphilis, chlamydia and gonorrhea.  These results will be available in approximately 3 days.  Please inform all sexual partners if you test positive for any of these diseases.

## 2019-05-16 NOTE — ED Provider Notes (Signed)
[facility] EMERGENCY DEPARTMENT Provider Note   CSN: [id number] Arrival date & time: 05/15/19  1849     History   Chief Complaint No chief complaint on file.   HPI Angel Rojas is a 28 y.o. female with a hx of ovarian cysts presents to the Emergency Department complaining of gradual, persistent, progressively worsening vaginal bleeding onset approximately 1 week ago.  Patient reports that she finished her menstrual cycle approximately 3 weeks ago and has continued to have brown spotting since that time.  1 week ago her brown spotting turned into bright red blood.  Patient reports her menstrual cycle is normally very irregular.  She is not taking any oral or hormonal contraceptive.  She is sexually active with one female partner.  She reports that he informed her he had been diagnosed with gonorrhea earlier today.  She reports foul-smelling discharge over the last week or so.  She denies fever, chills, abdominal pain, nausea, vomiting, diarrhea, weakness, dizziness, syncope.  Patient denies a history of anemia.  No aggravating or alleviating factors.  She has not ever established with an OB/GYN.     The history is provided by the patient and medical records. No language interpreter was used.    Past Medical History:  Diagnosis Date  . Bilateral ovarian cysts     There are no active problems to display for this patient.   Past Surgical History:  Procedure Laterality Date  . DILATION AND CURETTAGE OF UTERUS       OB History    Gravida  1   Para      Term      Preterm      AB  1   Living        SAB  1   TAB      Ectopic      Multiple      Live Births  0            Home Medications    Prior to Admission medications   Medication Sig Start Date End Date Taking? Authorizing Provider  diphenhydrAMINE-zinc acetate (BENADRYL EXTRA STRENGTH) cream Apply 1 application topically 3 (three) times daily as needed for itching. 06/29/16    Seabron Spates, CNM  doxycycline (VIBRAMYCIN) 100 MG capsule Take 1 capsule (100 mg total) by mouth 2 (two) times daily. 03/31/19   Hedges, Dellis Filbert, PA-C  HYDROcodone-acetaminophen (NORCO/VICODIN) 5-325 MG tablet Take 1 tablet by mouth every 4 (four) hours as needed. 04/02/17   Ashley Murrain, NP  hydrOXYzine (ATARAX/VISTARIL) 25 MG tablet Take 1 tablet (25 mg total) by mouth every 6 (six) hours. 01/04/17   Veryl Speak, MD  ibuprofen (ADVIL,MOTRIN) 600 MG tablet Take 1 tablet (600 mg total) by mouth every 6 (six) hours as needed. 03/29/17   Domenic Moras, PA-C  loratadine (CLARITIN) 10 MG tablet Take 1 tablet (10 mg total) by mouth daily. 06/30/16   Seabron Spates, CNM  metroNIDAZOLE (FLAGYL) 500 MG tablet Take 1 tablet (500 mg total) by mouth 2 (two) times daily. 01/07/16   Focht, Fraser Din, PA  ofloxacin (OCUFLOX) 0.3 % ophthalmic solution Place 1 drop into the right ear 2 (two) times daily. Until better 08/29/16   Varney Biles, MD  ondansetron (ZOFRAN ODT) 8 MG disintegrating tablet Take 1 tablet (8 mg total) by mouth every 8 (eight) hours as needed for nausea or vomiting. 08/09/16   Pattricia Boss, MD  predniSONE (DELTASONE) 10 MG tablet Take 2 tablets (20  mg total) by mouth 2 (two) times daily. 01/04/17   Geoffery Lyons, MD    Family History No family history on file.  Social History Social History   Tobacco Use  . Smoking status: Current Some Day Smoker  . Smokeless tobacco: Never Used  Substance Use Topics  . Alcohol use: Yes    Comment: social  . Drug use: No     Allergies   Patient has no known allergies.   Review of Systems Review of Systems  Constitutional: Negative for appetite change, diaphoresis, fatigue, fever and unexpected weight change.  HENT: Negative for mouth sores.   Eyes: Negative for visual disturbance.  Respiratory: Negative for cough, chest tightness, shortness of breath and wheezing.   Cardiovascular: Negative for chest pain.  Gastrointestinal: Negative  for abdominal pain, constipation, diarrhea, nausea and vomiting.  Endocrine: Negative for polydipsia, polyphagia and polyuria.  Genitourinary: Positive for vaginal bleeding. Negative for dysuria, frequency, hematuria and urgency.  Musculoskeletal: Negative for back pain and neck stiffness.  Skin: Negative for rash.  Allergic/Immunologic: Negative for immunocompromised state.  Neurological: Negative for syncope, light-headedness and headaches.  Hematological: Does not bruise/bleed easily.  Psychiatric/Behavioral: Negative for sleep disturbance. The patient is not nervous/anxious.      Physical Exam Updated Vital Signs BP 117/84 (BP Location: Right Arm)   Pulse 80   Temp 98.6 F (37 C) (Oral)   Resp 20   Ht 5\' 8"  (1.727 m)   Wt 86.2 kg   SpO2 99%   BMI 28.89 kg/m   Physical Exam Vitals signs and nursing note reviewed. Exam conducted with a chaperone present.  Constitutional:      General: She is not in acute distress.    Appearance: She is well-developed. She is not diaphoretic.  HENT:     Head: Normocephalic and atraumatic.  Eyes:     Conjunctiva/sclera: Conjunctivae normal.  Neck:     Musculoskeletal: Normal range of motion.  Cardiovascular:     Rate and Rhythm: Normal rate and regular rhythm.     Heart sounds: Normal heart sounds. No murmur.  Pulmonary:     Effort: Pulmonary effort is normal. No respiratory distress.     Breath sounds: Normal breath sounds. No wheezing.  Abdominal:     General: Bowel sounds are normal.     Palpations: Abdomen is soft.     Tenderness: There is no abdominal tenderness. There is no guarding or rebound.     Hernia: There is no hernia in the left inguinal area.  Genitourinary:    Labia:        Right: No rash, tenderness or lesion.        Left: No rash, tenderness or lesion.      Vagina: No signs of injury and foreign body. Bleeding present. No erythema or tenderness.     Cervix: No cervical motion tenderness, discharge or friability.      Uterus: Not deviated, not enlarged, not fixed and not tender.      Adnexa:        Right: No mass, tenderness or fullness.         Left: No mass, tenderness or fullness.       Comments: Moderate amount of blood in the vaginal vault Musculoskeletal: Normal range of motion.  Skin:    General: Skin is warm and dry.     Findings: No erythema.  Neurological:     Mental Status: She is alert.      ED Treatments /  Results  Labs (all labs ordered are listed, but only abnormal results are displayed) Labs Reviewed  WET PREP, GENITAL - Abnormal; Notable for the following components:      Result Value   Trich, Wet Prep PRESENT (*)    All other components within normal limits  CBC WITH DIFFERENTIAL/PLATELET  BASIC METABOLIC PANEL  RPR  HIV ANTIBODY (ROUTINE TESTING W REFLEX)  HIV4GL SAVE TUBE  I-STAT BETA HCG BLOOD, ED ([facility], [facility], [facility] ONLY)  GC/CHLAMYDIA PROBE AMP ([facility]) NOT AT [facility]    Procedures Procedures (including critical care time)  Medications Ordered in ED Medications  metroNIDAZOLE (FLAGYL) tablet 2,000 mg (has no administration in time range)  ondansetron (ZOFRAN-ODT) disintegrating tablet 4 mg (has no administration in time range)  azithromycin (ZITHROMAX) tablet 1,000 mg (1,000 mg Oral Given 05/16/19 0602)  cefTRIAXone (ROCEPHIN) injection 250 mg (250 mg Intramuscular Given 05/16/19 0602)  lidocaine (PF) (XYLOCAINE) 1 % injection 5 mL (5 mLs Intradermal Given 05/16/19 0602)     Initial Impression / Assessment and Plan / ED Course  I have reviewed the triage vital signs and the nursing notes.  Pertinent labs & imaging results that were available during my care of the patient were reviewed by me and considered in my medical decision making (see chart for details).  Clinical Course as of May 15 618  Fri May 16, 2019  0611 Within normal limits  Hemoglobin: 13.4 [HM]    Clinical Course User Index [HM] Melbert Botelho, Dahlia ClientHannah, [place]PA-C       Patient presents with  concerns of potential STD and likely exposure to gonorrhea.  Gonorrhea and clinic cultures pending along with HIV and RPR.  Wet prep with trichomonas.  Patient given Flagyl.  Patient treated with azithromycin and Rocephin.  Patient is not pregnant.  CBC shows hemoglobin within normal limits; no large-volume vaginal bleeding.  Electrolytes also within normal limits.  Patient will be referred to OB/GYN for close follow-up and to establish care.  Pt understands that they have GC/Chlamydia cultures pending and that they will need to inform all sexual partners if results return positive.  Pt not concerning for PID because hemodynamically stable and no cervical motion tenderness on pelvic exam. Discussed importance of using protection when sexually active.    Final Clinical Impressions(s) / ED Diagnoses   Final diagnoses:  Vaginal bleeding  Exposure to sexually transmitted disease (STD)    ED Discharge Orders    None       Shloimy Michalski, Boyd KerbsHannah, PA-C 05/16/19 [id number]    Dione BoozeGlick, David, MD 05/16/19 [phone]

## 2019-05-19 LAB — GC/CHLAMYDIA PROBE AMP (~~LOC~~) NOT AT ARMC
Chlamydia: NEGATIVE
Neisseria Gonorrhea: POSITIVE — AB

## 2019-09-18 ENCOUNTER — Emergency Department (HOSPITAL_BASED_OUTPATIENT_CLINIC_OR_DEPARTMENT_OTHER)
Admission: EM | Admit: 2019-09-18 | Discharge: 2019-09-18 | Disposition: A | Discharge status: Home / Self Care | Payer: Self-pay | Attending: Emergency Medicine | Admitting: Emergency Medicine

## 2019-09-18 ENCOUNTER — Encounter (HOSPITAL_BASED_OUTPATIENT_CLINIC_OR_DEPARTMENT_OTHER): Payer: Self-pay | Admitting: *Deleted

## 2019-09-18 ENCOUNTER — Other Ambulatory Visit: Payer: Self-pay

## 2019-09-18 DIAGNOSIS — F17200 Nicotine dependence, unspecified, uncomplicated: Secondary | ICD-10-CM | POA: Insufficient documentation

## 2019-09-18 DIAGNOSIS — Z79899 Other long term (current) drug therapy: Secondary | ICD-10-CM | POA: Insufficient documentation

## 2019-09-18 DIAGNOSIS — M5442 Lumbago with sciatica, left side: Secondary | ICD-10-CM | POA: Insufficient documentation

## 2019-09-18 DIAGNOSIS — U071 COVID-19: Secondary | ICD-10-CM | POA: Insufficient documentation

## 2019-09-18 LAB — URINALYSIS, ROUTINE W REFLEX MICROSCOPIC
Bilirubin Urine: NEGATIVE
Glucose, UA: NEGATIVE mg/dL
Hgb urine dipstick: NEGATIVE
Ketones, ur: NEGATIVE mg/dL
Leukocytes,Ua: NEGATIVE
Nitrite: NEGATIVE
Protein, ur: NEGATIVE mg/dL
Specific Gravity, Urine: >1.03 — ABNORMAL HIGH (ref 1.005–1.030)
pH: 5.5 (ref 5.0–8.0)

## 2019-09-18 LAB — PREGNANCY, URINE: Preg Test, Ur: NEGATIVE

## 2019-09-18 MED ORDER — KETOROLAC TROMETHAMINE 60 MG/2ML IM SOLN
60.0000 mg | Freq: Once | INTRAMUSCULAR | Status: AC
  Administered 2019-09-18: 60 mg via INTRAMUSCULAR
  Filled 2019-09-18: qty 2

## 2019-09-18 MED ORDER — CYCLOBENZAPRINE HCL 10 MG PO TABS: 10.0000 mg | ORAL_TABLET | Freq: Two times a day (BID) | ORAL | 0 refills | Status: AC | PRN

## 2019-09-18 MED ORDER — NAPROXEN 375 MG PO TABS: 375.0000 mg | ORAL_TABLET | Freq: Two times a day (BID) | ORAL | 0 refills | Status: AC

## 2019-09-18 MED FILL — NAPROXEN 375 MG TABLET: 375 | 10 days supply | Qty: 20 | Fill #0

## 2019-09-18 MED FILL — CYCLOBENZAPRINE HCL 10 MG T: 10 | 10 days supply | Qty: 20 | Fill #0

## 2019-09-18 NOTE — ED Triage Notes (Addendum)
Pt c/o left lower back pain which radiates into left hip x 4 days , denies injury DX covid 2/4

## 2019-09-18 NOTE — ED Provider Notes (Signed)
[facility] EMERGENCY DEPARTMENT Provider Note   CSN: [id number] Arrival date & time: 09/18/19  1401     History Chief Complaint  Patient presents with  . Back Pain    Angel Rojas is a 29 y.o. female.  Patient presents to the ED for evaluation of low back pain that radiates into the left hip. Pain started 4 days ago. She states she has been sleeping on her sofa since being diagnosed COVID positive on 09/11/19. No known recent injury. Denies prior injury to back. No urinary symptoms. She has tried heat therapy and ibuprofen without relief.  The history is provided by the patient. No language interpreter was used.  Back Pain Location:  Lumbar spine Quality:  Shooting Radiates to: left hip. Pain severity:  Moderate Pain is:  Same all the time Onset quality:  Sudden Timing:  Constant Progression:  Unchanged Chronicity:  New Context: not recent injury   Ineffective treatments:  Ibuprofen and heating pad Associated symptoms: no abdominal pain, no bladder incontinence, no bowel incontinence, no dysuria, no fever, no paresthesias, no perianal numbness and no weakness   Risk factors: no recent surgery        Past Medical History:  Diagnosis Date  . Bilateral ovarian cysts     There are no problems to display for this patient.   Past Surgical History:  Procedure Laterality Date  . DILATION AND CURETTAGE OF UTERUS       OB History    Gravida  1   Para      Term      Preterm      AB  1   Living        SAB  1   TAB      Ectopic      Multiple      Live Births  0           No family history on file.  Social History   Tobacco Use  . Smoking status: Current Some Day Smoker  . Smokeless tobacco: Never Used  Substance Use Topics  . Alcohol use: Yes    Comment: social  . Drug use: No    Home Medications Prior to Admission medications   Medication Sig Start Date End Date Taking? Authorizing Provider  cyclobenzaprine (FLEXERIL) 10  MG tablet Take 1 tablet (10 mg total) by mouth 2 (two) times daily as needed for muscle spasms. 09/18/19   Etta Quill, NP  diphenhydrAMINE-zinc acetate (BENADRYL EXTRA STRENGTH) cream Apply 1 application topically 3 (three) times daily as needed for itching. 06/29/16   Seabron Spates, CNM  doxycycline (VIBRAMYCIN) 100 MG capsule Take 1 capsule (100 mg total) by mouth 2 (two) times daily. 03/31/19   Hedges, Dellis Filbert, PA-C  HYDROcodone-acetaminophen (NORCO/VICODIN) 5-325 MG tablet Take 1 tablet by mouth every 4 (four) hours as needed. 04/02/17   Ashley Murrain, NP  hydrOXYzine (ATARAX/VISTARIL) 25 MG tablet Take 1 tablet (25 mg total) by mouth every 6 (six) hours. 01/04/17   Veryl Speak, MD  ibuprofen (ADVIL,MOTRIN) 600 MG tablet Take 1 tablet (600 mg total) by mouth every 6 (six) hours as needed. 03/29/17   Domenic Moras, PA-C  loratadine (CLARITIN) 10 MG tablet Take 1 tablet (10 mg total) by mouth daily. 06/30/16   Seabron Spates, CNM  metroNIDAZOLE (FLAGYL) 500 MG tablet Take 1 tablet (500 mg total) by mouth 2 (two) times daily. 01/07/16   Focht, Fraser Din, PA  naproxen (NAPROSYN) 375 MG tablet  Take 1 tablet (375 mg total) by mouth 2 (two) times daily. 09/18/19   Felicie Morn, NP  ofloxacin (OCUFLOX) 0.3 % ophthalmic solution Place 1 drop into the right ear 2 (two) times daily. Until better 08/29/16   Derwood Kaplan, MD  ondansetron (ZOFRAN ODT) 8 MG disintegrating tablet Take 1 tablet (8 mg total) by mouth every 8 (eight) hours as needed for nausea or vomiting. 08/09/16   Margarita Grizzle, MD  predniSONE (DELTASONE) 10 MG tablet Take 2 tablets (20 mg total) by mouth 2 (two) times daily. 01/04/17   Geoffery Lyons, MD    Allergies    Patient has no known allergies.  Review of Systems   Review of Systems  Constitutional: Negative for fever.  Gastrointestinal: Negative for abdominal pain and bowel incontinence.  Genitourinary: Negative for bladder incontinence and dysuria.  Musculoskeletal: Positive for  back pain.  Neurological: Negative for weakness and paresthesias.  All other systems reviewed and are negative.   Physical Exam Updated Vital Signs BP (!) 140/100   Pulse 100   Temp 98.7 F (37.1 C) (Oral)   Resp 16   Ht 5\' 8"  (1.727 m)   Wt 86.2 kg   LMP 08/19/2019   SpO2 100%   BMI 28.89 kg/m   Physical Exam Vitals and nursing note reviewed.  Constitutional:      Appearance: Normal appearance.  HENT:     Head: Normocephalic.     Mouth/Throat:     Mouth: Mucous membranes are moist.  Eyes:     Conjunctiva/sclera: Conjunctivae normal.  Cardiovascular:     Rate and Rhythm: Normal rate and regular rhythm.  Pulmonary:     Effort: Pulmonary effort is normal.     Breath sounds: Normal breath sounds.  Abdominal:     Palpations: Abdomen is soft.  Musculoskeletal:        General: Tenderness present. No signs of injury. Normal range of motion.     Lumbar back: No bony tenderness.       Back:  Skin:    General: Skin is warm and dry.  Neurological:     Mental Status: She is alert and oriented to person, place, and time.     Sensory: No sensory deficit.     Motor: No weakness.  Psychiatric:        Mood and Affect: Mood normal.     ED Results / Procedures / Treatments   Labs (all labs ordered are listed, but only abnormal results are displayed) Labs Reviewed  URINALYSIS, ROUTINE W REFLEX MICROSCOPIC - Abnormal; Notable for the following components:      Result Value   APPearance HAZY (*)    Specific Gravity, Urine >1.030 (*)    All other components within normal limits  PREGNANCY, URINE    EKG None  Radiology No results found.  Procedures Procedures (including critical care time)  Medications Ordered in ED Medications  ketorolac (TORADOL) injection 60 mg (60 mg Intramuscular Given 09/18/19 1434)    ED Course  I have reviewed the triage vital signs and the nursing notes.  Vitals:   09/18/19 1408  BP: (!) 140/100  Pulse: 100  Resp: 16  Temp: 98.7  F (37.1 C)  TempSrc: Oral  SpO2: 100%  Weight: 86.2 kg  Height: 5\' 8"  (1.727 m)     Pertinent labs & imaging results that were available during my care of the patient were reviewed by me and considered in my medical decision making (see chart for details).  MDM Rules/Calculators/A&P                      Patient with back pain.  No neurological deficits and normal neuro exam.  Patient is ambulatory.  No loss of bowel or bladder control.  No concern for cauda equina.  No fever, night sweats, weight loss, h/o cancer, IVDA, no recent procedure to back. No urinary symptoms suggestive of UTI.  Supportive care and return precaution discussed. Appears safe for discharge at this time. Follow up as indicated in discharge paperwork.  Final Clinical Impression(s) / ED Diagnoses Final diagnoses:  Acute midline low back pain with left-sided sciatica    Rx / DC Orders ED Discharge Orders         Ordered    naproxen (NAPROSYN) 375 MG tablet  2 times daily     09/18/19 1433    cyclobenzaprine (FLEXERIL) 10 MG tablet  2 times daily PRN     09/18/19 1433           Felicie Morn, NP 09/18/19 1454    Sabas Sous, MD 09/18/19 1504

## 2019-09-18 NOTE — Discharge Instructions (Addendum)
Please take medication as directed. Refer to attached instructions for treatment options and return precautions. Follow-up with your primary care provider if no improvement.
